# Patient Record
Sex: Female | Born: 1945 | Race: Black or African American | Hispanic: No | Marital: Married | Smoking: Never smoker
Health system: Southern US, Community
[De-identification: ages and names within clinical notes are randomized; demographics above are authoritative.]

## PROBLEM LIST (undated history)

## (undated) DIAGNOSIS — K635 Polyp of colon: Secondary | ICD-10-CM

## (undated) DIAGNOSIS — E119 Type 2 diabetes mellitus without complications: Secondary | ICD-10-CM

## (undated) DIAGNOSIS — E7800 Pure hypercholesterolemia, unspecified: Secondary | ICD-10-CM

## (undated) DIAGNOSIS — Z780 Asymptomatic menopausal state: Secondary | ICD-10-CM

## (undated) DIAGNOSIS — I10 Essential (primary) hypertension: Secondary | ICD-10-CM

## (undated) DIAGNOSIS — E785 Hyperlipidemia, unspecified: Secondary | ICD-10-CM

## (undated) DIAGNOSIS — I25810 Atherosclerosis of coronary artery bypass graft(s) without angina pectoris: Secondary | ICD-10-CM

## (undated) DIAGNOSIS — I2510 Atherosclerotic heart disease of native coronary artery without angina pectoris: Secondary | ICD-10-CM

## (undated) HISTORY — PX: BREAST BIOPSY: SHX20

## (undated) HISTORY — PX: CHOLECYSTECTOMY: SHX55

## (undated) HISTORY — PX: APPENDECTOMY: SHX54

## (undated) HISTORY — PX: BREAST EXCISIONAL BIOPSY: SUR124

---

## 2001-05-16 HISTORY — PX: ABDOMINAL HYSTERECTOMY: SHX81

## 2005-07-07 ENCOUNTER — Ambulatory Visit: Payer: Self-pay | Admitting: Unknown Physician Specialty

## 2006-07-13 ENCOUNTER — Ambulatory Visit: Payer: Self-pay | Admitting: Unknown Physician Specialty

## 2007-01-13 ENCOUNTER — Ambulatory Visit: Payer: Self-pay | Admitting: Gastroenterology

## 2007-09-07 ENCOUNTER — Ambulatory Visit: Payer: Self-pay | Admitting: Unknown Physician Specialty

## 2007-12-15 HISTORY — PX: CORONARY ARTERY BYPASS GRAFT: SHX141

## 2007-12-18 ENCOUNTER — Ambulatory Visit: Payer: Self-pay | Admitting: Cardiology

## 2008-12-19 ENCOUNTER — Ambulatory Visit: Payer: Self-pay | Admitting: Unknown Physician Specialty

## 2010-01-01 ENCOUNTER — Ambulatory Visit: Payer: Self-pay | Admitting: Unknown Physician Specialty

## 2011-05-19 ENCOUNTER — Ambulatory Visit: Payer: Self-pay | Admitting: Unknown Physician Specialty

## 2011-05-28 ENCOUNTER — Ambulatory Visit: Payer: Self-pay | Admitting: Gastroenterology

## 2011-06-01 LAB — PATHOLOGY REPORT

## 2011-08-16 ENCOUNTER — Ambulatory Visit: Payer: Self-pay | Admitting: Unknown Physician Specialty

## 2011-08-17 ENCOUNTER — Ambulatory Visit: Payer: Self-pay | Admitting: Unknown Physician Specialty

## 2011-09-17 ENCOUNTER — Ambulatory Visit: Payer: Self-pay | Admitting: Unknown Physician Specialty

## 2012-05-19 ENCOUNTER — Ambulatory Visit: Payer: Self-pay | Admitting: Physician Assistant

## 2013-07-17 ENCOUNTER — Ambulatory Visit: Payer: Self-pay | Admitting: Physician Assistant

## 2014-01-24 DIAGNOSIS — I2510 Atherosclerotic heart disease of native coronary artery without angina pectoris: Secondary | ICD-10-CM | POA: Insufficient documentation

## 2014-03-05 DIAGNOSIS — E785 Hyperlipidemia, unspecified: Secondary | ICD-10-CM | POA: Insufficient documentation

## 2014-09-18 ENCOUNTER — Ambulatory Visit: Payer: Self-pay | Admitting: Physician Assistant

## 2015-08-12 ENCOUNTER — Other Ambulatory Visit: Payer: Self-pay | Admitting: Physician Assistant

## 2015-08-12 DIAGNOSIS — Z1231 Encounter for screening mammogram for malignant neoplasm of breast: Secondary | ICD-10-CM

## 2015-09-22 ENCOUNTER — Ambulatory Visit
Admission: RE | Admit: 2015-09-22 | Discharge: 2015-09-22 | Disposition: A | Discharge status: Home / Self Care | Payer: BC Managed Care – PPO | Source: Ambulatory Visit | Attending: Physician Assistant | Admitting: Physician Assistant

## 2015-09-22 DIAGNOSIS — Z1231 Encounter for screening mammogram for malignant neoplasm of breast: Secondary | ICD-10-CM

## 2015-11-06 ENCOUNTER — Encounter: Payer: Self-pay | Admitting: *Deleted

## 2015-11-07 ENCOUNTER — Ambulatory Visit: Payer: BC Managed Care – PPO | Admitting: Anesthesiology

## 2015-11-07 ENCOUNTER — Encounter: Payer: Self-pay | Admitting: *Deleted

## 2015-11-07 ENCOUNTER — Encounter
Admission: RE | Disposition: A | Discharge status: Home / Self Care | Payer: Self-pay | Source: Ambulatory Visit | Attending: Gastroenterology

## 2015-11-07 ENCOUNTER — Ambulatory Visit
Admission: RE | Admit: 2015-11-07 | Discharge: 2015-11-07 | Disposition: A | Discharge status: Home / Self Care | Payer: BC Managed Care – PPO | Source: Ambulatory Visit | Attending: Gastroenterology | Admitting: Gastroenterology

## 2015-11-07 DIAGNOSIS — E7800 Pure hypercholesterolemia, unspecified: Secondary | ICD-10-CM | POA: Insufficient documentation

## 2015-11-07 DIAGNOSIS — E119 Type 2 diabetes mellitus without complications: Secondary | ICD-10-CM | POA: Insufficient documentation

## 2015-11-07 DIAGNOSIS — E785 Hyperlipidemia, unspecified: Secondary | ICD-10-CM | POA: Diagnosis not present

## 2015-11-07 DIAGNOSIS — Z1211 Encounter for screening for malignant neoplasm of colon: Secondary | ICD-10-CM | POA: Insufficient documentation

## 2015-11-07 DIAGNOSIS — Z7982 Long term (current) use of aspirin: Secondary | ICD-10-CM | POA: Diagnosis not present

## 2015-11-07 DIAGNOSIS — Z888 Allergy status to other drugs, medicaments and biological substances status: Secondary | ICD-10-CM | POA: Diagnosis not present

## 2015-11-07 DIAGNOSIS — Z86010 Personal history of colonic polyps: Secondary | ICD-10-CM | POA: Insufficient documentation

## 2015-11-07 DIAGNOSIS — I25810 Atherosclerosis of coronary artery bypass graft(s) without angina pectoris: Secondary | ICD-10-CM | POA: Diagnosis not present

## 2015-11-07 DIAGNOSIS — I10 Essential (primary) hypertension: Secondary | ICD-10-CM | POA: Insufficient documentation

## 2015-11-07 DIAGNOSIS — Z880 Allergy status to penicillin: Secondary | ICD-10-CM | POA: Insufficient documentation

## 2015-11-07 DIAGNOSIS — K5730 Diverticulosis of large intestine without perforation or abscess without bleeding: Secondary | ICD-10-CM | POA: Diagnosis not present

## 2015-11-07 HISTORY — DX: Atherosclerosis of coronary artery bypass graft(s) without angina pectoris: I25.810

## 2015-11-07 HISTORY — DX: Essential (primary) hypertension: I10

## 2015-11-07 HISTORY — DX: Atherosclerotic heart disease of native coronary artery without angina pectoris: I25.10

## 2015-11-07 HISTORY — DX: Polyp of colon: K63.5

## 2015-11-07 HISTORY — PX: COLONOSCOPY WITH PROPOFOL: SHX5780

## 2015-11-07 HISTORY — DX: Asymptomatic menopausal state: Z78.0

## 2015-11-07 HISTORY — DX: Type 2 diabetes mellitus without complications: E11.9

## 2015-11-07 HISTORY — DX: Pure hypercholesterolemia, unspecified: E78.00

## 2015-11-07 HISTORY — DX: Hyperlipidemia, unspecified: E78.5

## 2015-11-07 LAB — GLUCOSE, CAPILLARY: Glucose-Capillary: 124 mg/dL — ABNORMAL HIGH (ref 65–99)

## 2015-11-07 SURGERY — COLONOSCOPY WITH PROPOFOL
Anesthesia: General

## 2015-11-07 MED ORDER — PROPOFOL 500 MG/50ML IV EMUL
INTRAVENOUS | Status: DC | PRN
  Administered 2015-11-07: 100 ug/kg/min via INTRAVENOUS

## 2015-11-07 MED ORDER — SODIUM CHLORIDE 0.9 % IV SOLN
INTRAVENOUS | Status: DC
Start: 2015-11-07 — End: 2015-11-07
  Administered 2015-11-07: 12:00:00 via INTRAVENOUS

## 2015-11-07 MED ORDER — PROPOFOL 10 MG/ML IV BOLUS
INTRAVENOUS | Status: DC | PRN
  Administered 2015-11-07: 80 mg via INTRAVENOUS

## 2015-11-07 MED ORDER — SODIUM CHLORIDE 0.9 % IV SOLN: INTRAVENOUS | Status: DC

## 2015-11-07 NOTE — Anesthesia Preprocedure Evaluation (Signed)
Anesthesia Evaluation  Patient identified by MRN, date of birth, ID band Patient awake    Reviewed: Allergy & Precautions, NPO status , Patient's Chart, lab work & pertinent test results  History of Anesthesia Complications Negative for: history of anesthetic complications  Airway Mallampati: II       Dental  (+) Upper Dentures, Partial Lower   Pulmonary neg pulmonary ROS,           Cardiovascular hypertension, Pt. on medications and Pt. on home beta blockers + CAD and + CABG       Neuro/Psych negative neurological ROS     GI/Hepatic negative GI ROS, Neg liver ROS,   Endo/Other  diabetes (pre-diabetes)  Renal/GU negative Renal ROS     Musculoskeletal   Abdominal   Peds  Hematology negative hematology ROS (+)   Anesthesia Other Findings   Reproductive/Obstetrics                             Anesthesia Physical Anesthesia Plan  ASA: II  Anesthesia Plan: General   Post-op Pain Management:    Induction: Intravenous  Airway Management Planned: Nasal Cannula  Additional Equipment:   Intra-op Plan:   Post-operative Plan:   Informed Consent: I have reviewed the patients History and Physical, chart, labs and discussed the procedure including the risks, benefits and alternatives for the proposed anesthesia with the patient or authorized representative who has indicated his/her understanding and acceptance.     Plan Discussed with:   Anesthesia Plan Comments:         Anesthesia Quick Evaluation

## 2015-11-07 NOTE — Op Note (Signed)
[facility] Gastroenterology Patient Name: Pam Baker Procedure Date: 11/07/2015 1:30 PM MRN: [id number] Account #: [id number] Date of Birth: 11-03-1945 Admit Type: Outpatient Age: 70 Room: [facility] ENDO ROOM 3 Gender: Female Note Status: Finalized Procedure:            Colonoscopy Indications:          Personal history of colonic polyps Providers:            Lollie Sails, MD Referring MD:         Precious Bard, MD (Referring MD) Medicines:            Monitored Anesthesia Care Complications:        No immediate complications. Procedure:            Pre-Anesthesia Assessment:                       - ASA Grade Assessment: III - A patient with severe                        systemic disease.                       After obtaining informed consent, the colonoscope was                        passed under direct vision. Throughout the procedure,                        the patient's blood pressure, pulse, and oxygen                        saturations were monitored continuously. The                        Colonoscope was introduced through the anus and                        advanced to the the cecum, identified by appendiceal                        orifice and ileocecal valve. The colonoscopy was                        performed without difficulty. The patient tolerated the                        procedure well. The quality of the bowel preparation                        was good. Findings:      Multiple small and large-mouthed diverticula were found in the sigmoid       colon, descending colon, transverse colon and ascending colon.      Multiple small and large-mouthed diverticula were found in the sigmoid       colon. Petechia were visualized in association with the diverticular       opening.      The digital rectal exam was normal.      The retroflexed view of the distal rectum and anal verge was normal and       showed no anal or rectal  abnormalities. Impression:           -  Diverticulosis in the sigmoid colon, in the                        descending colon, in the transverse colon and in the                        ascending colon.                       - Mild diverticulosis in the sigmoid colon. Petechia                        were visualized in association with the diverticular                        opening.                       - The distal rectum and anal verge are normal on                        retroflexion view.                       - No specimens collected. Recommendation:       - Discharge patient to home.                       - Repeat colonoscopy in 5 years for adenoma                        surveillance. Procedure Code(s):    --- Professional ---                       [phone], Colonoscopy, flexible; diagnostic, including                        collection of specimen(s) by brushing or washing, when                        performed (separate procedure) Diagnosis Code(s):    --- Professional ---                       Z86.010, Personal history of colonic polyps                       K57.30, Diverticulosis of large intestine without                        perforation or abscess without bleeding CPT copyright 2016 American Medical Association. All rights reserved. The codes documented in this report are preliminary and upon coder review may  be revised to meet current compliance requirements. Lollie Sails, MD 11/07/2015 1:56:31 PM This report has been signed electronically. Number of Addenda: 0 Note Initiated On: 11/07/2015 1:30 PM Scope Withdrawal Time: 0 hours 5 minutes 48 seconds  Total Procedure Duration: 0 hours 12 minutes 17 seconds       [facility]

## 2015-11-07 NOTE — Transfer of Care (Signed)
Immediate Anesthesia Transfer of Care Note  Patient: Pam Baker  Procedure(s) Performed: Procedure(s): COLONOSCOPY WITH PROPOFOL (N/A)  Patient Location: PACU and Endoscopy Unit  Anesthesia Type:General  Level of Consciousness: awake, alert  and oriented  Airway & Oxygen Therapy: Patient Spontanous Breathing and Patient connected to nasal cannula oxygen  Post-op Assessment: Report given to RN and Post -op Vital signs reviewed and stable  Post vital signs: Reviewed and stable  Last Vitals:  Filed Vitals:   11/07/15 1151 11/07/15 1400  BP: 156/69 98/53  Pulse: 71 78  Temp: 36.9 C 36.3 C  Resp: 15 18    Complications: No apparent anesthesia complications

## 2015-11-07 NOTE — Anesthesia Postprocedure Evaluation (Signed)
Anesthesia Post Note  Patient: Pam Baker  Procedure(s) Performed: Procedure(s) (LRB): COLONOSCOPY WITH PROPOFOL (N/A)  Patient location during evaluation: Endoscopy Anesthesia Type: General Level of consciousness: awake and alert Pain management: pain level controlled Vital Signs Assessment: post-procedure vital signs reviewed and stable Respiratory status: spontaneous breathing and respiratory function stable Cardiovascular status: stable Anesthetic complications: no    Last Vitals:  Filed Vitals:   11/07/15 1420 11/07/15 1430  BP: 119/56 97/68  Pulse: 54 52  Temp:    Resp: 17 20    Last Pain: There were no vitals filed for this visit.               KEPHART,WILLIAM K

## 2015-11-07 NOTE — H&P (Signed)
Outpatient short stay form Pre-procedure 11/07/2015 1:28 PM Lollie Sails MD  Primary Physician: Jackelyn Poling NP  Reason for visit:  Colonoscopy  History of present illness:  Patient is a 70 year old female presenting today for colonoscopy. His personal history of adenomatous colon polyps with the last colonoscopy being done in October 2012. She does take a 81 mg aspirin daily takes no other blood thinners or aspirin products. She tolerated her prep well.    Current facility-administered medications:  .  0.9 %  sodium chloride infusion, , Intravenous, Continuous, Lollie Sails, MD, Last Rate: 20 mL/hr at 11/07/15 1218 .  0.9 %  sodium chloride infusion, , Intravenous, Continuous, Lollie Sails, MD  Prescriptions prior to admission  Medication Sig Dispense Refill Last Dose  . aspirin 81 MG tablet Take 81 mg by mouth daily.   11/02/2015  . atorvastatin (LIPITOR) 20 MG tablet Take 20 mg by mouth daily.   11/06/2015 at Unknown time  . Calcium Carbonate-Vit D-Min (CALCIUM 600 + MINERALS) 600-200 MG-UNIT TABS Take by mouth.   Past Week at Unknown time  . losartan-hydrochlorothiazide (HYZAAR) 50-12.5 MG tablet Take 1 tablet by mouth daily.   11/07/2015 at 0730  . metoprolol succinate (TOPROL-XL) 100 MG 24 hr tablet Take 100 mg by mouth daily. Take with or immediately following a meal.   11/07/2015 at 0730     Allergies  Allergen Reactions  . Amoxicillin   . Lisinopril      Past Medical History  Diagnosis Date  . Colon polyps     personal history, adenomatous  . Coronary atherosclerosis of autologous vein bypass graft   . Coronary atherosclerosis of native coronary artery   . Diabetes mellitus type 2, uncomplicated (HCC)     diet-controlled  . Hypertension   . Hyperlipidemia   . Postmenopausal   . Pure hypercholesterolemia     Review of systems:      Physical Exam    Heart and lungs: Regular rate and rhythm without rub or gallop, lungs are bilaterally clear.   HEENT: Normocephalic atraumatic eyes are anicteric    Other:     Pertinant exam for procedure: Soft nontender nondistended bowel sounds positive normoactive.    Planned proceedures: Colonoscopy and indicated procedures. I have discussed the risks benefits and complications of procedures to include not limited to bleeding, infection, perforation and the risk of sedation and the patient wishes to proceed.    Lollie Sails, MD Gastroenterology 11/07/2015  1:28 PM

## 2015-11-08 ENCOUNTER — Encounter: Payer: Self-pay | Admitting: Gastroenterology

## 2016-08-12 ENCOUNTER — Other Ambulatory Visit: Payer: Self-pay | Admitting: Physician Assistant

## 2016-08-12 DIAGNOSIS — Z1239 Encounter for other screening for malignant neoplasm of breast: Secondary | ICD-10-CM

## 2016-08-12 DIAGNOSIS — Z0000 Encounter for general adult medical examination without abnormal findings: Secondary | ICD-10-CM

## 2017-09-19 ENCOUNTER — Other Ambulatory Visit: Payer: Self-pay | Admitting: Physician Assistant

## 2017-09-19 DIAGNOSIS — Z1231 Encounter for screening mammogram for malignant neoplasm of breast: Secondary | ICD-10-CM

## 2017-12-06 ENCOUNTER — Ambulatory Visit
Admission: RE | Admit: 2017-12-06 | Discharge: 2017-12-06 | Disposition: A | Discharge status: Home / Self Care | Payer: BC Managed Care – PPO | Source: Ambulatory Visit | Attending: Physician Assistant | Admitting: Physician Assistant

## 2017-12-06 DIAGNOSIS — Z1231 Encounter for screening mammogram for malignant neoplasm of breast: Secondary | ICD-10-CM | POA: Diagnosis present

## 2018-09-07 ENCOUNTER — Other Ambulatory Visit: Payer: Self-pay | Admitting: Physician Assistant

## 2018-09-07 DIAGNOSIS — Z1231 Encounter for screening mammogram for malignant neoplasm of breast: Secondary | ICD-10-CM

## 2019-01-29 ENCOUNTER — Ambulatory Visit
Admission: RE | Admit: 2019-01-29 | Discharge: 2019-01-29 | Disposition: A | Discharge status: Home / Self Care | Payer: BC Managed Care – PPO | Source: Ambulatory Visit | Attending: Physician Assistant | Admitting: Physician Assistant

## 2019-01-29 ENCOUNTER — Other Ambulatory Visit: Payer: Self-pay

## 2019-01-29 DIAGNOSIS — Z1231 Encounter for screening mammogram for malignant neoplasm of breast: Secondary | ICD-10-CM | POA: Diagnosis not present

## 2019-03-08 ENCOUNTER — Other Ambulatory Visit: Payer: Self-pay | Admitting: Physician Assistant

## 2019-03-08 DIAGNOSIS — R3121 Asymptomatic microscopic hematuria: Secondary | ICD-10-CM

## 2019-04-11 ENCOUNTER — Other Ambulatory Visit: Payer: Self-pay

## 2019-04-11 ENCOUNTER — Ambulatory Visit (INDEPENDENT_AMBULATORY_CARE_PROVIDER_SITE_OTHER): Payer: Self-pay | Admitting: Urology

## 2019-04-11 VITALS — BP 145/79 | HR 99 | Ht 61.0 in | Wt 211.0 lb

## 2019-04-11 DIAGNOSIS — K635 Polyp of colon: Secondary | ICD-10-CM | POA: Insufficient documentation

## 2019-04-11 DIAGNOSIS — I25810 Atherosclerosis of coronary artery bypass graft(s) without angina pectoris: Secondary | ICD-10-CM | POA: Insufficient documentation

## 2019-04-11 DIAGNOSIS — Z780 Asymptomatic menopausal state: Secondary | ICD-10-CM | POA: Insufficient documentation

## 2019-04-11 DIAGNOSIS — E119 Type 2 diabetes mellitus without complications: Secondary | ICD-10-CM | POA: Insufficient documentation

## 2019-04-11 DIAGNOSIS — I10 Essential (primary) hypertension: Secondary | ICD-10-CM | POA: Insufficient documentation

## 2019-04-11 DIAGNOSIS — R3129 Other microscopic hematuria: Secondary | ICD-10-CM

## 2019-04-11 LAB — URINALYSIS, COMPLETE
Bilirubin, UA: NEGATIVE
Glucose, UA: NEGATIVE
Ketones, UA: NEGATIVE
Nitrite, UA: NEGATIVE
Protein,UA: NEGATIVE
Specific Gravity, UA: 1.025 (ref 1.005–1.030)
Urobilinogen, Ur: 0.2 mg/dL (ref 0.2–1.0)
pH, UA: 5.5 (ref 5.0–7.5)

## 2019-04-11 LAB — MICROSCOPIC EXAMINATION

## 2019-04-11 NOTE — Progress Notes (Signed)
04/11/19 2:16 PM   Pam Baker 08/13/1946 [id number]  Referring provider: Marinda Elk, MD [place],  [place] [address]  CC: Asymptomatic microscopic hematuria  HPI: I saw Pam Baker in urology clinic today in consultation from Dr. Carrie Mew for asymptomatic microscopic hematuria.  She is a 73 year old female with DM2, hypertension, and CAD status post CABG who is been noted to have microscopic hematuria over the last 5 years on multiple urinalyses.  This is ranged from 4-10 RBCs to 10-50 RBCs per high-power field.  She has never undergone work-up previously.  There is no cross-sectional imaging to review.  She denies any history of gross hematuria or UTIs.  She denies any flank pain or history of nephrolithiasis.  She is a never smoker.  She previously worked in the school system and denies any other carcinogenic exposures.  There is no family history of any urologic malignancies.  There are no aggravating or alleviating factors.  Severity is mild to moderate.   PMH: Past Medical History:  Diagnosis Date  . Colon polyps    personal history, adenomatous  . Coronary atherosclerosis of autologous vein bypass graft   . Coronary atherosclerosis of native coronary artery   . Diabetes mellitus type 2, uncomplicated (HCC)    diet-controlled  . Hyperlipidemia   . Hypertension   . Postmenopausal   . Pure hypercholesterolemia     Surgical History: Past Surgical History:  Procedure Laterality Date  . ABDOMINAL HYSTERECTOMY  10/02   TAH-BSO secondary to postmenopausal bleeding and uterine fibroids  . APPENDECTOMY    . BREAST BIOPSY Bilateral    89 and 80  . CHOLECYSTECTOMY    . COLONOSCOPY WITH PROPOFOL N/A 11/07/2015   Procedure: COLONOSCOPY WITH PROPOFOL;  Surgeon: Lollie Sails, MD;  Location: [facility] ENDOSCOPY;  Service: Endoscopy;  Laterality: N/A;  . CORONARY ARTERY BYPASS GRAFT  05/09   single vessel CABG  surgery    Allergies:  Allergies  Allergen Reactions  . Amoxicillin   . Lisinopril     Family History: Family History  Problem Relation Age of Onset  . Breast cancer Neg Hx     Social History:  reports that she has never smoked. She does not have any smokeless tobacco history on file. She reports that she does not drink alcohol or use drugs.  ROS: Please see flowsheet from today's date for complete review of systems.  Physical Exam: BP (!) 145/79   Pulse 99   Ht 5\' 1"  (1.549 m)   Wt 211 lb (95.7 kg)   BMI 39.87 kg/m    Constitutional:  Alert and oriented, No acute distress. Cardiovascular: No clubbing, cyanosis, or edema. Respiratory: Normal respiratory effort, no increased work of breathing. GI: Abdomen is soft, nontender, nondistended, no abdominal masses GU: No CVA tenderness Lymph: No cervical or inguinal lymphadenopathy. Skin: No rashes, bruises or suspicious lesions. Neurologic: Grossly intact, no focal deficits, moving all 4 extremities. Psychiatric: Normal mood and affect.  Laboratory Data: Multiple prior urinalyses over the last 5 years with 4-10 RBCs, two with 10-50 RBCs  Urinalysis today 0-5 WBCs, 0-2 RBCs, few bacteria, nitrite negative  Pertinent Imaging: None to review  Assessment & Plan:   In summary, the patient is a 73 year old female with long history of microscopic hematuria ranging from 4-10 RBCs to 10-50 RBCs per high-power field.  We discussed common possible etiologies of microscopic hematuria including malignancy, urolithiasis, medical renal disease, and idiopathic. Standard workup recommended by  the AUA includes imaging with CT urogram to assess the upper tracts, and cystoscopy.  We also discussed the different risk categories of microscopic hematuria, and with her age she falls into the high risk category that warrants further work-up with cystoscopy and CT urogram.  RTC for cystoscopy and CT urogram to complete microscopic hematuria  work-up  Billey Co, MD  [facility] [address] [place], [place] [address] [phone]

## 2019-04-11 NOTE — Patient Instructions (Signed)
Cystoscopy Cystoscopy is a procedure that is used to help diagnose and sometimes treat conditions that affect the lower urinary tract. The lower urinary tract includes the bladder and the urethra. The urethra is the tube that drains urine from the bladder. Cystoscopy is done using a thin, tube-shaped instrument with a light and camera at the end (cystoscope). The cystoscope may be hard or flexible, depending on the goal of the procedure. The cystoscope is inserted through the urethra, into the bladder. Cystoscopy may be recommended if you have:  Urinary tract infections that keep coming back.  Blood in the urine (hematuria).  An inability to control when you urinate (urinary incontinence) or an overactive bladder.  Unusual cells found in a urine sample.  A blockage in the urethra, such as a urinary stone.  Painful urination.  An abnormality in the bladder found during an intravenous pyelogram (IVP) or CT scan. Cystoscopy may also be done to remove a sample of tissue to be examined under a microscope (biopsy). Tell a health care provider about:  Any allergies you have.  All medicines you are taking, including vitamins, herbs, eye drops, creams, and over-the-counter medicines.  Any problems you or family members have had with anesthetic medicines.  Any blood disorders you have.  Any surgeries you have had.  Any medical conditions you have.  Whether you are pregnant or may be pregnant. What are the risks? Generally, this is a safe procedure. However, problems may occur, including:  Infection.  Bleeding.  Allergic reactions to medicines.  Damage to other structures or organs. What happens before the procedure?  Ask your health care provider about: ? Changing or stopping your regular medicines. This is especially important if you are taking diabetes medicines or blood thinners. ? Taking medicines such as aspirin and ibuprofen. These medicines can thin your blood. Do not take  these medicines unless your health care provider tells you to take them. ? Taking over-the-counter medicines, vitamins, herbs, and supplements.  Follow instructions from your health care provider about eating or drinking restrictions.  Ask your health care provider what steps will be taken to help prevent infection. These may include: ? Washing skin with a germ-killing soap. ? Taking antibiotic medicine.  You may have an exam or testing, such as: ? X-rays of the bladder, urethra, or kidneys. ? Urine tests to check for signs of infection.  Plan to have someone take you home from the hospital or clinic. What happens during the procedure?   You will be given one or more of the following: ? A medicine to help you relax (sedative). ? A medicine to numb the area (local anesthetic).  The area around the opening of your urethra will be cleaned.  The cystoscope will be passed through your urethra into your bladder.  Germ-free (sterile) fluid will flow through the cystoscope to fill your bladder. The fluid will stretch your bladder so that your health care provider can clearly examine your bladder walls.  Your doctor will look at the urethra and bladder. Your doctor may take a biopsy or remove stones.  The cystoscope will be removed, and your bladder will be emptied. The procedure may vary among health care providers and hospitals. What can I expect after the procedure? After the procedure, it is common to have:  Some soreness or pain in your abdomen and urethra.  Urinary symptoms. These include: ? Mild pain or burning when you urinate. Pain should stop within a few minutes after you urinate. This   may last for up to 1 week. ? A small amount of blood in your urine for several days. ? Feeling like you need to urinate but producing only a small amount of urine. Follow these instructions at home: Medicines  Take over-the-counter and prescription medicines only as told by your health care  provider.  If you were prescribed an antibiotic medicine, take it as told by your health care provider. Do not stop taking the antibiotic even if you start to feel better. General instructions  Return to your normal activities as told by your health care provider. Ask your health care provider what activities are safe for you.  Do not drive for 24 hours if you were given a sedative during your procedure.  Watch for any blood in your urine. If the amount of blood in your urine increases, call your health care provider.  Follow instructions from your health care provider about eating or drinking restrictions.  If a tissue sample was removed for testing (biopsy) during your procedure, it is up to you to get your test results. Ask your health care provider, or the department that is doing the test, when your results will be ready.  Drink enough fluid to keep your urine pale yellow.  Keep all follow-up visits as told by your health care provider. This is important. Contact a health care provider if you:  Have pain that gets worse or does not get better with medicine, especially pain when you urinate.  Have trouble urinating.  Have more blood in your urine. Get help right away if you:  Have blood clots in your urine.  Have abdominal pain.  Have a fever or chills.  Are unable to urinate. Summary  Cystoscopy is a procedure that is used to help diagnose and sometimes treat conditions that affect the lower urinary tract.  Cystoscopy is done using a thin, tube-shaped instrument with a light and camera at the end.  After the procedure, it is common to have some soreness or pain in your abdomen and urethra.  Watch for any blood in your urine. If the amount of blood in your urine increases, call your health care provider.  If you were prescribed an antibiotic medicine, take it as told by your health care provider. Do not stop taking the antibiotic even if you start to feel better. This  information is not intended to replace advice given to you by your health care provider. Make sure you discuss any questions you have with your health care provider. Document Released: 07/30/2000 Document Revised: 07/25/2018 Document Reviewed: 07/25/2018 Elsevier Patient Education  2020 Elsevier Inc.  

## 2019-05-10 ENCOUNTER — Other Ambulatory Visit: Payer: Self-pay

## 2019-05-10 ENCOUNTER — Encounter: Payer: Self-pay | Admitting: Urology

## 2019-05-10 ENCOUNTER — Ambulatory Visit (INDEPENDENT_AMBULATORY_CARE_PROVIDER_SITE_OTHER): Payer: BC Managed Care – PPO | Admitting: Urology

## 2019-05-10 VITALS — BP 144/73 | HR 105 | Ht 61.0 in | Wt 209.0 lb

## 2019-05-10 DIAGNOSIS — R3129 Other microscopic hematuria: Secondary | ICD-10-CM | POA: Diagnosis not present

## 2019-05-10 LAB — URINALYSIS, COMPLETE
Bilirubin, UA: NEGATIVE
Glucose, UA: NEGATIVE
Ketones, UA: NEGATIVE
Nitrite, UA: NEGATIVE
Protein,UA: NEGATIVE
Specific Gravity, UA: 1.02 (ref 1.005–1.030)
Urobilinogen, Ur: 0.2 mg/dL (ref 0.2–1.0)
pH, UA: 5.5 (ref 5.0–7.5)

## 2019-05-10 LAB — MICROSCOPIC EXAMINATION: Bacteria, UA: NONE SEEN

## 2019-05-10 NOTE — Progress Notes (Signed)
Cystoscopy Procedure Note:  Indication: Microscopic hematuria  After informed consent and discussion of the procedure and its risks, Pam Baker was positioned and prepped in the standard fashion. Cystoscopy was performed with a flexible cystoscope. The urethra, bladder neck and entire bladder was visualized in a standard fashion. The ureteral orifices were visualized in their normal location and orientation.  No abnormalities on retroflexion.  The bladder mucosa was grossly normal throughout.  Imaging: CTU not performed yet, will call with results  Findings: Normal cystoscopy  Assessment and Plan: Obtain CTU, will call with results  Nickolas Madrid, MD 05/10/2019

## 2020-01-25 ENCOUNTER — Other Ambulatory Visit: Payer: Self-pay | Admitting: Physician Assistant

## 2020-01-25 DIAGNOSIS — Z1231 Encounter for screening mammogram for malignant neoplasm of breast: Secondary | ICD-10-CM

## 2020-02-06 ENCOUNTER — Ambulatory Visit
Admission: RE | Admit: 2020-02-06 | Discharge: 2020-02-06 | Disposition: A | Discharge status: Home / Self Care | Payer: BC Managed Care – PPO | Source: Ambulatory Visit | Attending: Physician Assistant | Admitting: Physician Assistant

## 2020-02-06 DIAGNOSIS — Z1231 Encounter for screening mammogram for malignant neoplasm of breast: Secondary | ICD-10-CM | POA: Diagnosis present

## 2021-01-08 ENCOUNTER — Other Ambulatory Visit: Payer: Self-pay | Admitting: Physician Assistant

## 2021-01-08 DIAGNOSIS — Z1231 Encounter for screening mammogram for malignant neoplasm of breast: Secondary | ICD-10-CM

## 2021-02-06 ENCOUNTER — Other Ambulatory Visit: Payer: Self-pay

## 2021-02-06 ENCOUNTER — Ambulatory Visit
Admission: RE | Admit: 2021-02-06 | Discharge: 2021-02-06 | Disposition: A | Discharge status: Home / Self Care | Payer: BC Managed Care – PPO | Source: Ambulatory Visit | Attending: Physician Assistant | Admitting: Physician Assistant

## 2021-02-06 DIAGNOSIS — Z1231 Encounter for screening mammogram for malignant neoplasm of breast: Secondary | ICD-10-CM | POA: Diagnosis not present

## 2021-03-16 ENCOUNTER — Encounter
Admission: RE | Disposition: A | Discharge status: Home / Self Care | Payer: Self-pay | Source: Home / Self Care | Attending: Gastroenterology

## 2021-03-16 ENCOUNTER — Ambulatory Visit
Admission: RE | Admit: 2021-03-16 | Discharge: 2021-03-16 | Disposition: A | Discharge status: Home / Self Care | Payer: BC Managed Care – PPO | Attending: Gastroenterology | Admitting: Gastroenterology

## 2021-03-16 ENCOUNTER — Ambulatory Visit: Payer: BC Managed Care – PPO | Admitting: Certified Registered Nurse Anesthetist

## 2021-03-16 DIAGNOSIS — I10 Essential (primary) hypertension: Secondary | ICD-10-CM | POA: Diagnosis not present

## 2021-03-16 DIAGNOSIS — E785 Hyperlipidemia, unspecified: Secondary | ICD-10-CM | POA: Insufficient documentation

## 2021-03-16 DIAGNOSIS — Z6837 Body mass index (BMI) 37.0-37.9, adult: Secondary | ICD-10-CM | POA: Insufficient documentation

## 2021-03-16 DIAGNOSIS — Z1211 Encounter for screening for malignant neoplasm of colon: Secondary | ICD-10-CM | POA: Diagnosis present

## 2021-03-16 DIAGNOSIS — Z951 Presence of aortocoronary bypass graft: Secondary | ICD-10-CM | POA: Insufficient documentation

## 2021-03-16 DIAGNOSIS — D120 Benign neoplasm of cecum: Secondary | ICD-10-CM | POA: Insufficient documentation

## 2021-03-16 DIAGNOSIS — Z880 Allergy status to penicillin: Secondary | ICD-10-CM | POA: Insufficient documentation

## 2021-03-16 DIAGNOSIS — Z888 Allergy status to other drugs, medicaments and biological substances status: Secondary | ICD-10-CM | POA: Insufficient documentation

## 2021-03-16 DIAGNOSIS — Z79899 Other long term (current) drug therapy: Secondary | ICD-10-CM | POA: Diagnosis not present

## 2021-03-16 DIAGNOSIS — D122 Benign neoplasm of ascending colon: Secondary | ICD-10-CM | POA: Insufficient documentation

## 2021-03-16 DIAGNOSIS — Z7982 Long term (current) use of aspirin: Secondary | ICD-10-CM | POA: Diagnosis not present

## 2021-03-16 DIAGNOSIS — E669 Obesity, unspecified: Secondary | ICD-10-CM | POA: Insufficient documentation

## 2021-03-16 DIAGNOSIS — K5730 Diverticulosis of large intestine without perforation or abscess without bleeding: Secondary | ICD-10-CM | POA: Diagnosis not present

## 2021-03-16 HISTORY — PX: COLONOSCOPY WITH PROPOFOL: SHX5780

## 2021-03-16 LAB — GLUCOSE, CAPILLARY: Glucose-Capillary: 99 mg/dL (ref 70–99)

## 2021-03-16 SURGERY — COLONOSCOPY WITH PROPOFOL
Anesthesia: General

## 2021-03-16 MED ORDER — PROPOFOL 500 MG/50ML IV EMUL
INTRAVENOUS | Status: AC
  Filled 2021-03-16: qty 50

## 2021-03-16 MED ORDER — PROPOFOL 10 MG/ML IV BOLUS
INTRAVENOUS | Status: AC
  Filled 2021-03-16: qty 20

## 2021-03-16 MED ORDER — SODIUM CHLORIDE 0.9 % IV SOLN
INTRAVENOUS | Status: DC
  Administered 2021-03-16: 20 mL/h via INTRAVENOUS

## 2021-03-16 MED ORDER — LIDOCAINE HCL (CARDIAC) PF 100 MG/5ML IV SOSY
PREFILLED_SYRINGE | INTRAVENOUS | Status: DC | PRN
  Administered 2021-03-16: 50 mg via INTRAVENOUS

## 2021-03-16 MED ORDER — PROPOFOL 10 MG/ML IV BOLUS
INTRAVENOUS | Status: DC | PRN
Start: 2021-03-16 — End: 2021-03-16
  Administered 2021-03-16: 50 mg via INTRAVENOUS

## 2021-03-16 MED ORDER — PROPOFOL 500 MG/50ML IV EMUL
INTRAVENOUS | Status: DC | PRN
  Administered 2021-03-16: 125 ug/kg/min via INTRAVENOUS

## 2021-03-16 NOTE — H&P (Signed)
Outpatient short stay form Pre-procedure 03/16/2021 10:17 AM Pam Miyamoto MD, MPH  Primary Physician: Dr. Carrie Mew  Reason for visit:  Surveillance colonoscopy  History of present illness:   75 y/o lady with history of obesity, hypertension, and HLD here for surveillance colonoscopy for history of polyps. Last colonoscopy was 5 years ago and only showed diverticulosis. No family history of GI malignancies. No blood thinners. No significant abdominal surgeries.    Current Facility-Administered Medications:    0.9 %  sodium chloride infusion, , Intravenous, Continuous, Tammala Weider, Hilton Cork, MD, Last Rate: 20 mL/hr at 03/16/21 0853, Restarted at 03/16/21 1009  Facility-Administered Medications Ordered in Other Encounters:    lidocaine (cardiac) 100 mg/66m (XYLOCAINE) injection 2%, , Intravenous, Anesthesia Intra-op, NWillette Alma CRNA, 50 mg at 03/16/21 1011  Medications Prior to Admission  Medication Sig Dispense Refill Last Dose   aspirin 81 MG tablet Take 81 mg by mouth daily.   03/15/2021   atorvastatin (LIPITOR) 20 MG tablet Take 20 mg by mouth daily.   03/15/2021   Calcium Carbonate-Vit D-Min (CALCIUM 600 + MINERALS) 600-200 MG-UNIT TABS Take by mouth.   03/15/2021   losartan-hydrochlorothiazide (HYZAAR) 50-12.5 MG tablet Take 1 tablet by mouth daily.   03/15/2021   metoprolol succinate (TOPROL-XL) 100 MG 24 hr tablet Take 100 mg by mouth daily. Take with or immediately following a meal.   03/16/2021 at 0500   valsartan-hydrochlorothiazide (DIOVAN-HCT) 80-12.5 MG tablet Take 1 tablet by mouth daily.   03/16/2021 at 0500     Allergies  Allergen Reactions   Amoxicillin    Lisinopril      Past Medical History:  Diagnosis Date   Colon polyps    personal history, adenomatous   Coronary atherosclerosis of autologous vein bypass graft    Coronary atherosclerosis of native coronary artery    Diabetes mellitus type 2, uncomplicated (HCC)    diet-controlled   Hyperlipidemia     Hypertension    Postmenopausal    Pure hypercholesterolemia     Review of systems:  Otherwise negative.    Physical Exam  Gen: Alert, oriented. Appears stated age.  HEENT: PERRLA. Lungs: No respiratory distress CV: RRR Abd: soft, benign, no masses Ext: No edema    Planned procedures: Proceed with colonoscopy. The patient understands the nature of the planned procedure, indications, risks, alternatives and potential complications including but not limited to bleeding, infection, perforation, damage to internal organs and possible oversedation/side effects from anesthesia. The patient agrees and gives consent to proceed.  Please refer to procedure notes for findings, recommendations and patient disposition/instructions.     TRaylene MiyamotoMD, MPH Gastroenterology 03/16/2021  10:17 AM

## 2021-03-16 NOTE — Anesthesia Preprocedure Evaluation (Signed)
Anesthesia Evaluation  Patient identified by MRN, date of birth, ID band Patient awake    Reviewed: Allergy & Precautions, NPO status , Patient's Chart, lab work & pertinent test results  History of Anesthesia Complications Negative for: history of anesthetic complications  Airway Mallampati: II       Dental  (+) Upper Dentures, Partial Lower, Missing   Pulmonary neg pulmonary ROS,    Pulmonary exam normal        Cardiovascular hypertension, Pt. on medications and Pt. on home beta blockers + CAD and + CABG   Rhythm:Regular Rate:Normal     Neuro/Psych negative neurological ROS  negative psych ROS   GI/Hepatic negative GI ROS, Neg liver ROS,   Endo/Other  diabetes (borderline)  Renal/GU negative Renal ROS     Musculoskeletal   Abdominal (+) + obese,   Peds  Hematology negative hematology ROS (+)   Anesthesia Other Findings   Reproductive/Obstetrics                             Anesthesia Physical  Anesthesia Plan  ASA: 3  Anesthesia Plan: General   Post-op Pain Management:    Induction: Intravenous  PONV Risk Score and Plan: Treatment may vary due to age or medical condition  Airway Management Planned: Nasal Cannula and Natural Airway  Additional Equipment:   Intra-op Plan:   Post-operative Plan:   Informed Consent: I have reviewed the patients History and Physical, chart, labs and discussed the procedure including the risks, benefits and alternatives for the proposed anesthesia with the patient or authorized representative who has indicated his/her understanding and acceptance.     Dental advisory given  Plan Discussed with: Anesthesiologist and CRNA  Anesthesia Plan Comments:         Anesthesia Quick Evaluation

## 2021-03-16 NOTE — Interval H&P Note (Signed)
History and Physical Interval Note:  03/16/2021 10:20 AM  Pam Baker  has presented today for surgery, with the diagnosis of HX.OF COLON POLYPS.  The various methods of treatment have been discussed with the patient and family. After consideration of risks, benefits and other options for treatment, the patient has consented to  Procedure(s): COLONOSCOPY WITH PROPOFOL (N/A) as a surgical intervention.  The patient's history has been reviewed, patient examined, no change in status, stable for surgery.  I have reviewed the patient's chart and labs.  Questions were answered to the patient's satisfaction.     Lesly Rubenstein  Ok to proceed with colonoscopy

## 2021-03-16 NOTE — Transfer of Care (Signed)
Immediate Anesthesia Transfer of Care Note  Patient: Pam Baker  Procedure(s) Performed: COLONOSCOPY WITH PROPOFOL  Patient Location: PACU  Anesthesia Type:General  Level of Consciousness: awake, alert  and oriented  Airway & Oxygen Therapy: Patient Spontanous Breathing and Patient connected to nasal cannula oxygen  Post-op Assessment: Report given to RN and Post -op Vital signs reviewed and stable  Post vital signs: Reviewed and stable  Last Vitals:  Vitals Value Taken Time  BP 94/58 03/16/21 1044  Temp 35.7 C 03/16/21 1044  Pulse 66 03/16/21 1044  Resp 16 03/16/21 1044  SpO2 99 % 03/16/21 1044  Vitals shown include unvalidated device data.  Last Pain:  Vitals:   03/16/21 1044  TempSrc: Temporal  PainSc: Asleep         Complications: No notable events documented.

## 2021-03-16 NOTE — Op Note (Signed)
[facility] Patient Name: Pam Baker Procedure Date: 03/16/2021 10:04 AM MRN: [id number] Account #: [id number] Date of Birth: [birth year] Admit Type: Outpatient Age: 75 Room: [facility] ENDO ROOM 3 Gender: Female Note Status: Finalized Procedure:             Colonoscopy Indications:           Surveillance: Personal history of adenomatous polyps                         on last colonoscopy > 5 years ago Providers:             Andrey Farmer MD, MD Medicines:             Monitored Anesthesia Care Complications:         No immediate complications. Estimated blood loss:                         Minimal. Procedure:             Pre-Anesthesia Assessment:                        - Prior to the procedure, a History and Physical was                         performed, and patient medications and allergies were                         reviewed. The patient is competent. The risks and                         benefits of the procedure and the sedation options and                         risks were discussed with the patient. All questions                         were answered and informed consent was obtained.                         Patient identification and proposed procedure were                         verified by the physician, the nurse, the anesthetist                         and the technician in the endoscopy suite. Mental                         Status Examination: alert and oriented. Airway                         Examination: normal oropharyngeal airway and neck                         mobility. Respiratory Examination: clear to                         auscultation. CV Examination: normal. Prophylactic  Antibiotics: The patient does not require prophylactic                         antibiotics. Prior Anticoagulants: The patient has                         taken no previous anticoagulant or antiplatelet                          agents. ASA Grade Assessment: II - A patient with mild                         systemic disease. After reviewing the risks and                         benefits, the patient was deemed in satisfactory                         condition to undergo the procedure. The anesthesia                         plan was to use monitored anesthesia care (MAC).                         Immediately prior to administration of medications,                         the patient was re-assessed for adequacy to receive                         sedatives. The heart rate, respiratory rate, oxygen                         saturations, blood pressure, adequacy of pulmonary                         ventilation, and response to care were monitored                         throughout the procedure. The physical status of the                         patient was re-assessed after the procedure.                        After obtaining informed consent, the colonoscope was                         passed under direct vision. Throughout the procedure,                         the patient's blood pressure, pulse, and oxygen                         saturations were monitored continuously. The                         Colonoscope was introduced through the anus and  advanced to the the cecum, identified by appendiceal                         orifice and ileocecal valve. The colonoscopy was                         performed without difficulty. The patient tolerated                         the procedure well. The quality of the bowel                         preparation was good. Findings:      The perianal and digital rectal examinations were normal.      A 1 mm polyp was found in the cecum. The polyp was sessile. The polyp       was removed with a jumbo cold forceps. Resection and retrieval were       complete. Estimated blood loss was minimal.      A 2 mm polyp was found in the ascending colon. The polyp was  sessile.       The polyp was removed with a jumbo cold forceps. Resection and retrieval       were complete. Estimated blood loss was minimal.      A 3 mm polyp was found in the proximal transverse colon. The polyp was       sessile. The polyp was removed with a cold snare. Resection was       complete, but the polyp tissue was not retrieved. Estimated blood loss       was minimal.      Multiple small-mouthed diverticula were found in the sigmoid colon,       descending colon, transverse colon, hepatic flexure, ascending colon and       cecum.      The exam was otherwise without abnormality on direct and retroflexion       views. Impression:            - One 1 mm polyp in the cecum, removed with a jumbo                         cold forceps. Resected and retrieved.                        - One 2 mm polyp in the ascending colon, removed with                         a jumbo cold forceps. Resected and retrieved.                        - One 3 mm polyp in the proximal transverse colon,                         removed with a cold snare. Complete resection. Polyp                         tissue not retrieved.                        - Diverticulosis in the sigmoid colon,  in the                         descending colon, in the transverse colon, at the                         hepatic flexure, in the ascending colon and in the                         cecum.                        - The examination was otherwise normal on direct and                         retroflexion views. Recommendation:        - Discharge patient to home.                        - Resume previous diet.                        - Continue present medications.                        - Await pathology results.                        - Repeat colonoscopy is not recommended due to current                         age (58 years or older) for surveillance.                        - Return to referring physician as previously                          scheduled. Procedure Code(s):     --- Professional ---                        [phone], Colonoscopy, flexible; with removal of                         tumor(s), polyp(s), or other lesion(s) by snare                         technique                        45380, 50, Colonoscopy, flexible; with biopsy, single                         or multiple Diagnosis Code(s):     --- Professional ---                        Z86.010, Personal history of colonic polyps                        K63.5, Polyp of colon  K57.30, Diverticulosis of large intestine without                         perforation or abscess without bleeding CPT copyright 2019 [employer]. All rights reserved. The codes documented in this report are preliminary and upon coder review may  be revised to meet current compliance requirements. Andrey Farmer MD, MD 03/16/2021 10:51:56 AM Number of Addenda: 0 Note Initiated On: 03/16/2021 10:04 AM Scope Withdrawal Time: 0 hours 11 minutes 20 seconds  Total Procedure Duration: 0 hours 16 minutes 17 seconds  Estimated Blood Loss:  Estimated blood loss was minimal.      [facility]

## 2021-03-17 ENCOUNTER — Encounter: Payer: Self-pay | Admitting: Gastroenterology

## 2021-03-17 LAB — SURGICAL PATHOLOGY

## 2021-03-17 NOTE — Anesthesia Postprocedure Evaluation (Signed)
Anesthesia Post Note  Patient: Pam Baker  Procedure(s) Performed: COLONOSCOPY WITH PROPOFOL  Patient location during evaluation: Endoscopy Anesthesia Type: General Level of consciousness: awake and alert Pain management: pain level controlled Vital Signs Assessment: post-procedure vital signs reviewed and stable Respiratory status: spontaneous breathing, nonlabored ventilation and respiratory function stable Cardiovascular status: blood pressure returned to baseline and stable Postop Assessment: no apparent nausea or vomiting Anesthetic complications: no   No notable events documented.   Last Vitals:  Vitals:   03/16/21 0841 03/16/21 1044  BP: (!) 153/69 (!) 94/58  Pulse: 80   Resp: 20   Temp: (!) 35.8 C (!) 35.7 C  SpO2: 100%     Last Pain:  Vitals:   03/17/21 0749  TempSrc:   PainSc: 0-No pain                 [place] Ouch

## 2021-09-28 ENCOUNTER — Other Ambulatory Visit: Payer: Self-pay | Admitting: Physician Assistant

## 2021-09-28 DIAGNOSIS — Z1231 Encounter for screening mammogram for malignant neoplasm of breast: Secondary | ICD-10-CM

## 2022-02-09 ENCOUNTER — Ambulatory Visit
Admission: RE | Admit: 2022-02-09 | Discharge: 2022-02-09 | Disposition: A | Discharge status: Home / Self Care | Payer: BC Managed Care – PPO | Source: Ambulatory Visit | Attending: Physician Assistant | Admitting: Physician Assistant

## 2022-02-09 DIAGNOSIS — Z1231 Encounter for screening mammogram for malignant neoplasm of breast: Secondary | ICD-10-CM | POA: Diagnosis present

## 2023-01-25 ENCOUNTER — Other Ambulatory Visit: Payer: Self-pay | Admitting: Physician Assistant

## 2023-01-25 DIAGNOSIS — Z1231 Encounter for screening mammogram for malignant neoplasm of breast: Secondary | ICD-10-CM

## 2023-02-11 ENCOUNTER — Ambulatory Visit
Admission: RE | Admit: 2023-02-11 | Discharge: 2023-02-11 | Disposition: A | Discharge status: Home / Self Care | Payer: BC Managed Care – PPO | Source: Ambulatory Visit | Attending: Physician Assistant | Admitting: Physician Assistant

## 2023-02-11 DIAGNOSIS — Z1231 Encounter for screening mammogram for malignant neoplasm of breast: Secondary | ICD-10-CM | POA: Diagnosis present

## 2023-02-28 IMAGING — MG MM DIGITAL SCREENING BILAT W/ TOMO AND CAD
8 of 15 series · 8 of 40 positions shown · non-contrast
Comparison: Previous exam(s).

CLINICAL DATA: Screening.

EXAM:
DIGITAL SCREENING BILATERAL MAMMOGRAM WITH TOMOSYNTHESIS AND CAD
TECHNIQUE: Bilateral screening digital craniocaudal and mediolateral oblique
mammograms were obtained. Bilateral screening digital breast
tomosynthesis was performed. The images were evaluated with
computer-aided detection.

[R MLO synth-2D (1 of 2)]
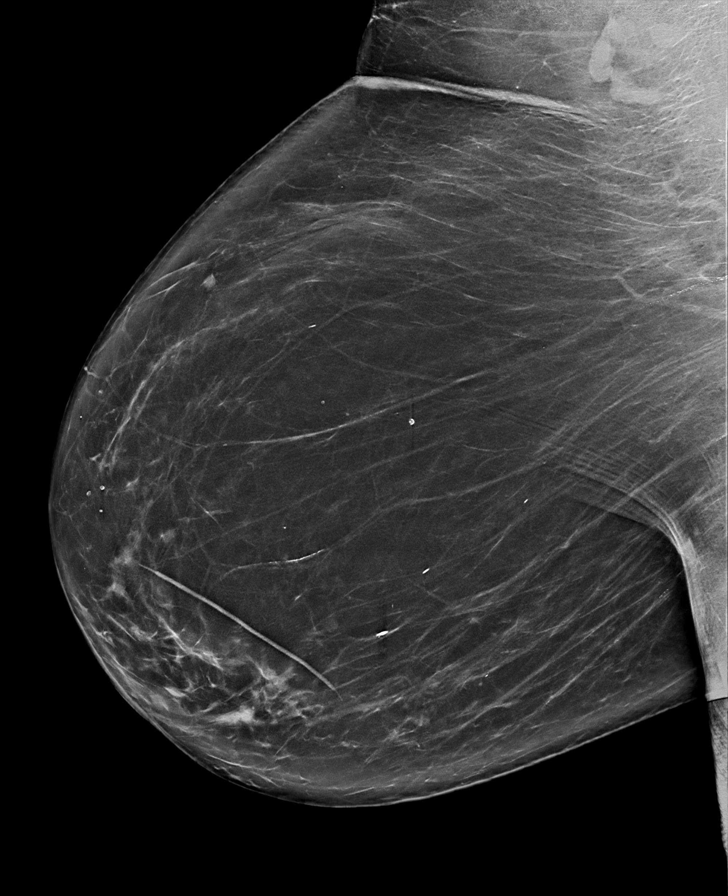

[R CC synth-2D]
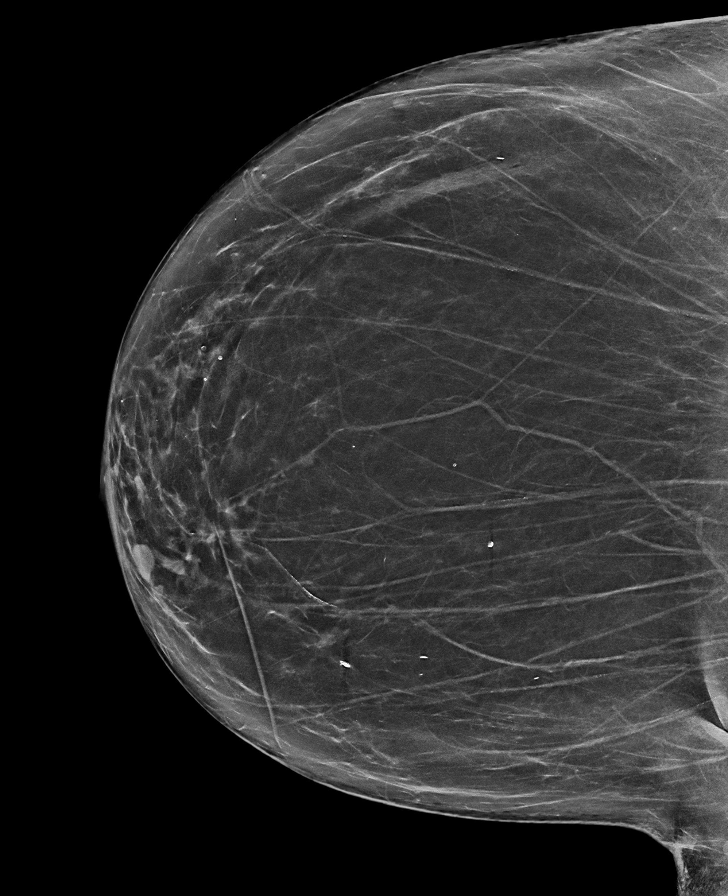

[L CV synth-2D]
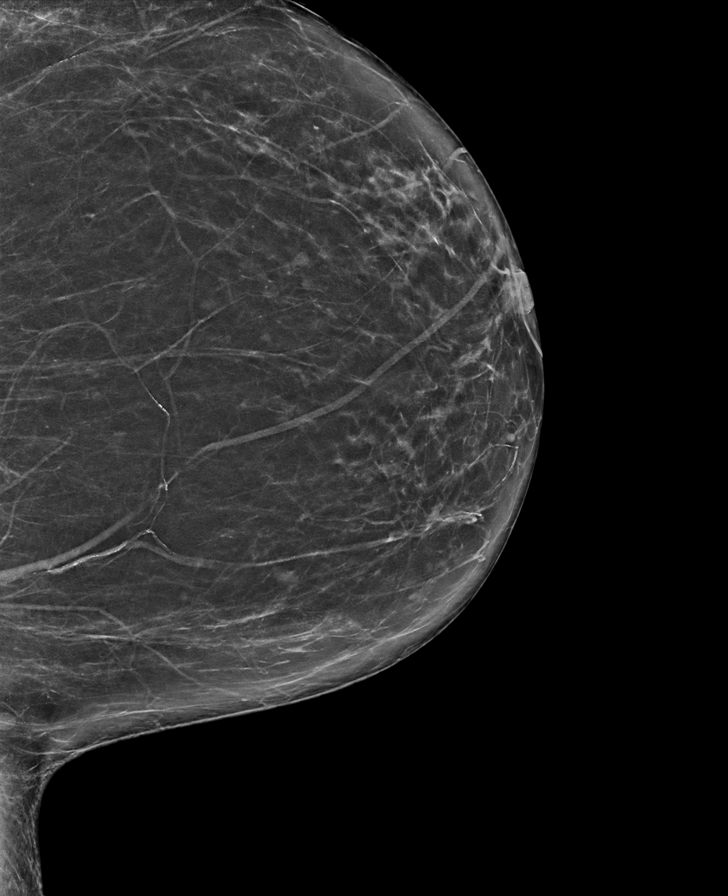

[L CC synth-2D]
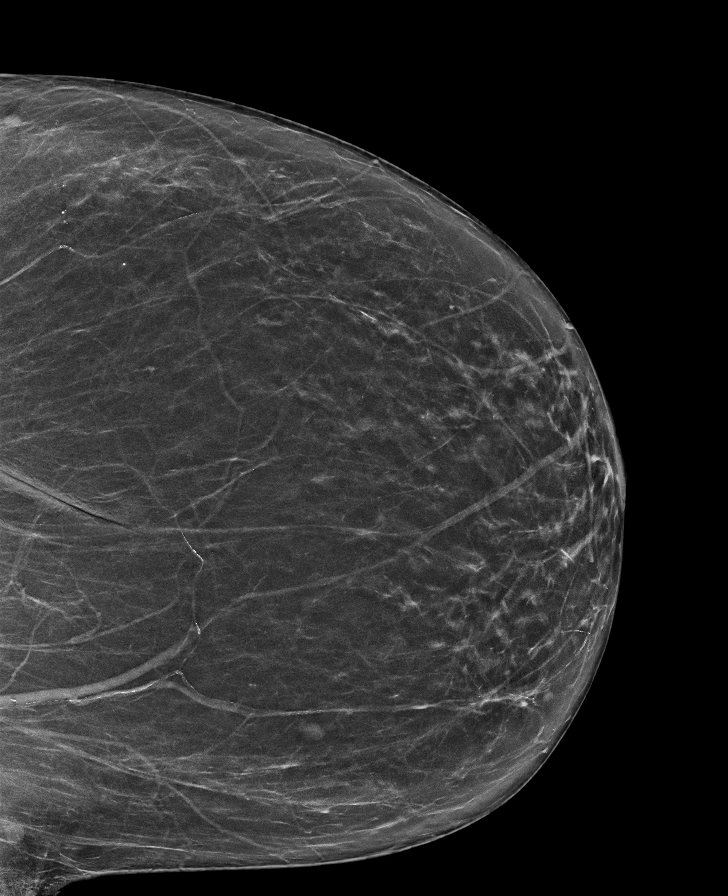

[L MLO synth-2D]
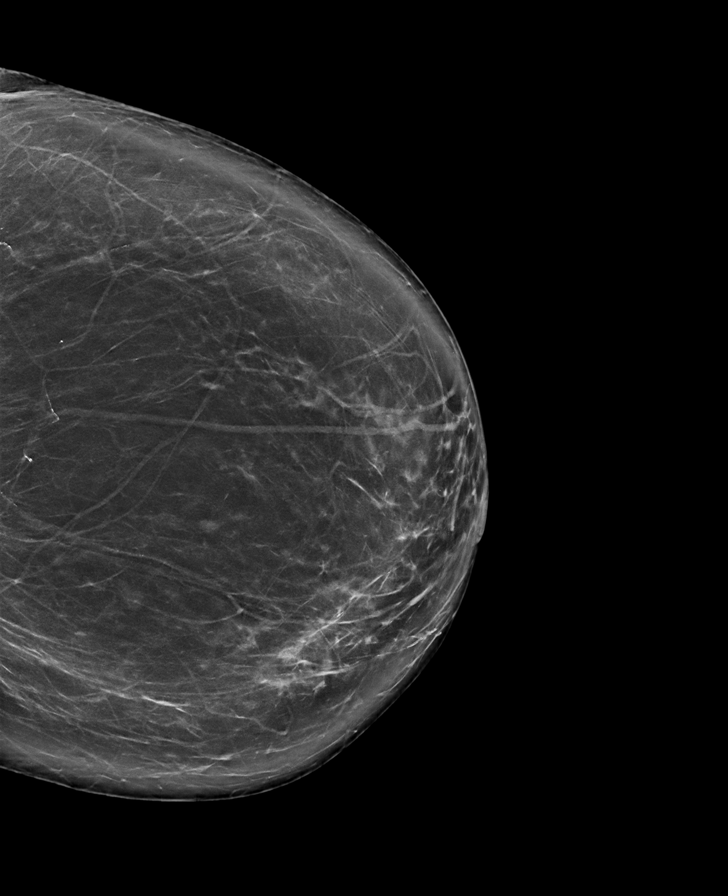

[R CV synth-2D]
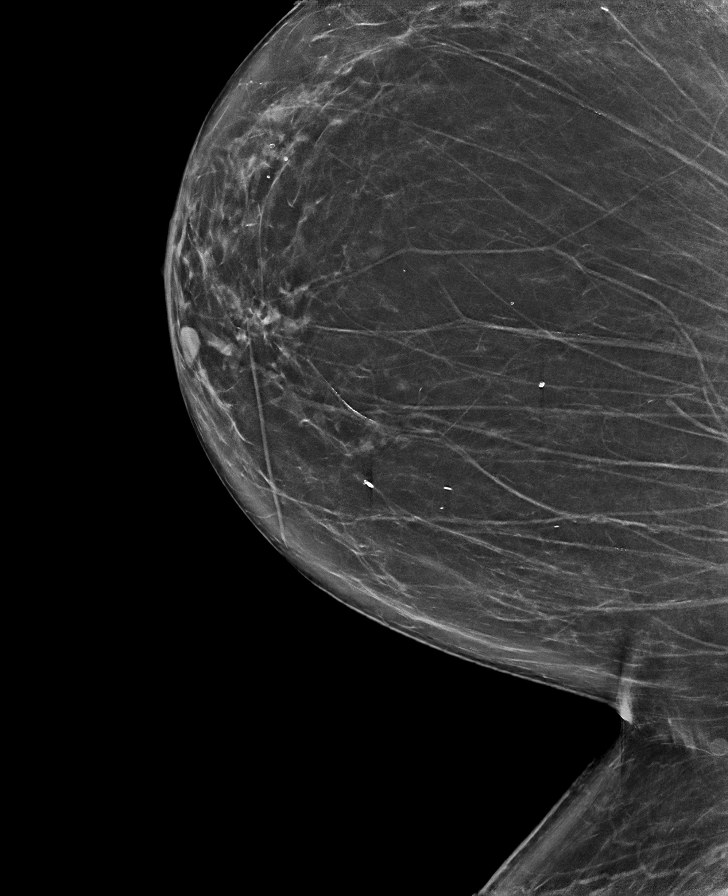

[R MLO synth-2D (2 of 2)]
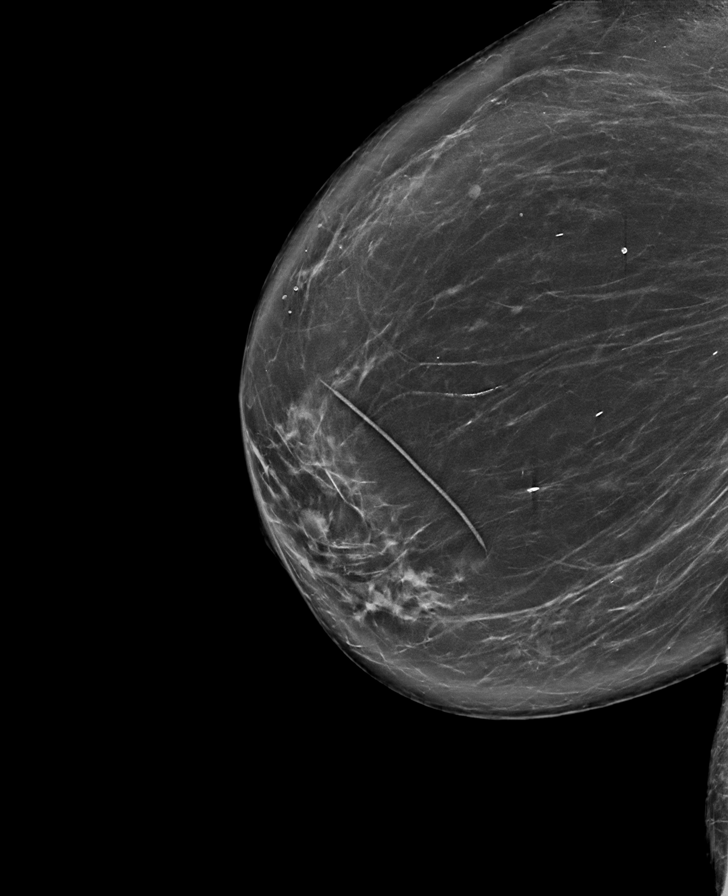

[R CC tomo · tomo slice 53/78.0]
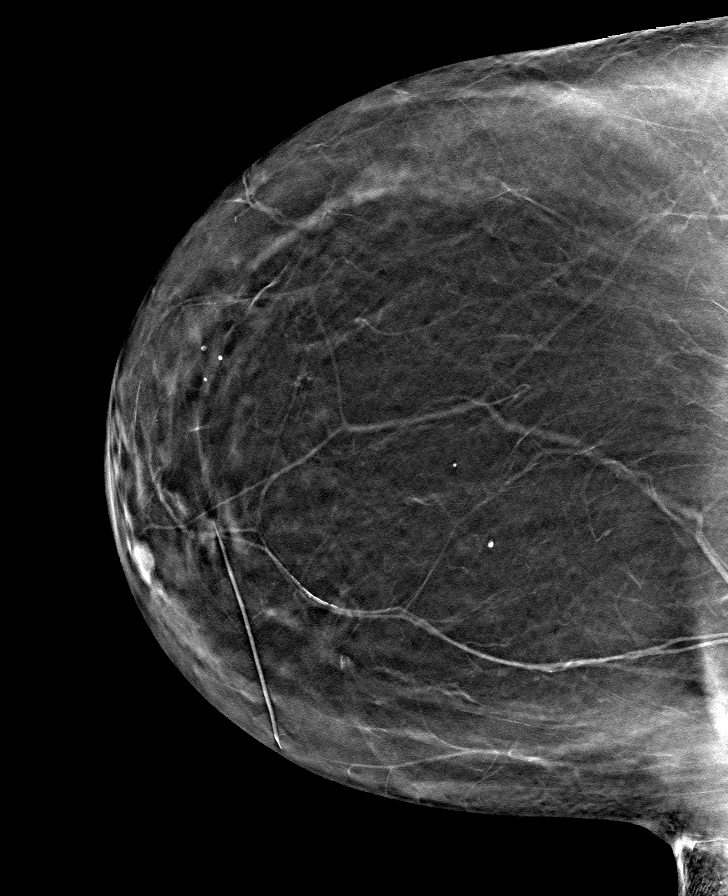

[8 of 40 positions shown; findings below may reference images not displayed]

ACR Breast Density Category b: There are scattered areas of
fibroglandular density.
FINDINGS: There are no findings suspicious for malignancy.
IMPRESSION: No mammographic evidence of malignancy. A result letter of this
screening mammogram will be mailed directly to the patient.

RECOMMENDATION:
Screening mammogram in one year. (Code:51-O-LD2)

BI-RADS CATEGORY  1: Negative.

## 2023-04-05 ENCOUNTER — Other Ambulatory Visit: Payer: Self-pay | Admitting: Physician Assistant

## 2023-04-05 DIAGNOSIS — R634 Abnormal weight loss: Secondary | ICD-10-CM

## 2023-04-11 ENCOUNTER — Ambulatory Visit: Admission: RE | Admit: 2023-04-11 | Payer: BC Managed Care – PPO | Source: Ambulatory Visit

## 2023-05-09 ENCOUNTER — Other Ambulatory Visit: Payer: Self-pay | Admitting: *Deleted

## 2023-05-09 ENCOUNTER — Inpatient Hospital Stay: Payer: BC Managed Care – PPO

## 2023-05-09 ENCOUNTER — Encounter: Payer: Self-pay | Admitting: Internal Medicine

## 2023-05-09 ENCOUNTER — Other Ambulatory Visit: Payer: Self-pay | Admitting: Internal Medicine

## 2023-05-09 ENCOUNTER — Inpatient Hospital Stay: Payer: BC Managed Care – PPO | Attending: Internal Medicine | Admitting: Internal Medicine

## 2023-05-09 VITALS — BP 140/61 | HR 65 | Temp 97.8°F | Wt 163.9 lb

## 2023-05-09 DIAGNOSIS — E1122 Type 2 diabetes mellitus with diabetic chronic kidney disease: Secondary | ICD-10-CM | POA: Insufficient documentation

## 2023-05-09 DIAGNOSIS — Z79899 Other long term (current) drug therapy: Secondary | ICD-10-CM | POA: Insufficient documentation

## 2023-05-09 DIAGNOSIS — R778 Other specified abnormalities of plasma proteins: Secondary | ICD-10-CM

## 2023-05-09 DIAGNOSIS — I25810 Atherosclerosis of coronary artery bypass graft(s) without angina pectoris: Secondary | ICD-10-CM | POA: Insufficient documentation

## 2023-05-09 DIAGNOSIS — I129 Hypertensive chronic kidney disease with stage 1 through stage 4 chronic kidney disease, or unspecified chronic kidney disease: Secondary | ICD-10-CM | POA: Insufficient documentation

## 2023-05-09 DIAGNOSIS — N183 Chronic kidney disease, stage 3 unspecified: Secondary | ICD-10-CM | POA: Insufficient documentation

## 2023-05-09 DIAGNOSIS — R634 Abnormal weight loss: Secondary | ICD-10-CM

## 2023-05-09 DIAGNOSIS — E7800 Pure hypercholesterolemia, unspecified: Secondary | ICD-10-CM | POA: Diagnosis not present

## 2023-05-09 DIAGNOSIS — R899 Unspecified abnormal finding in specimens from other organs, systems and tissues: Secondary | ICD-10-CM

## 2023-05-09 DIAGNOSIS — Z7982 Long term (current) use of aspirin: Secondary | ICD-10-CM | POA: Insufficient documentation

## 2023-05-09 DIAGNOSIS — R948 Abnormal results of function studies of other organs and systems: Secondary | ICD-10-CM

## 2023-05-09 LAB — COMPREHENSIVE METABOLIC PANEL
ALT: 13 U/L (ref 0–44)
AST: 27 U/L (ref 15–41)
Albumin: 4 g/dL (ref 3.5–5.0)
Alkaline Phosphatase: 88 U/L (ref 38–126)
Anion gap: 12 (ref 5–15)
BUN: 21 mg/dL (ref 8–23)
CO2: 27 mmol/L (ref 22–32)
Calcium: 9.4 mg/dL (ref 8.9–10.3)
Chloride: 100 mmol/L (ref 98–111)
Creatinine, Ser: 1.49 mg/dL — ABNORMAL HIGH (ref 0.44–1.00)
GFR, Estimated: 36 mL/min — ABNORMAL LOW (ref >60)
Glucose, Bld: 111 mg/dL — ABNORMAL HIGH (ref 70–99)
Potassium: 3 mmol/L — ABNORMAL LOW (ref 3.5–5.1)
Sodium: 139 mmol/L (ref 135–145)
Total Bilirubin: 1.3 mg/dL — ABNORMAL HIGH (ref 0.3–1.2)
Total Protein: 7.5 g/dL (ref 6.5–8.1)

## 2023-05-09 LAB — CBC WITH DIFFERENTIAL/PLATELET
Abs Immature Granulocytes: 0.01 10*3/uL (ref 0.00–0.07)
Basophils Absolute: 0 10*3/uL (ref 0.0–0.1)
Basophils Relative: 1 %
Eosinophils Absolute: 0.1 10*3/uL (ref 0.0–0.5)
Eosinophils Relative: 1 %
HCT: 34.7 % — ABNORMAL LOW (ref 36.0–46.0)
Hemoglobin: 11.3 g/dL — ABNORMAL LOW (ref 12.0–15.0)
Immature Granulocytes: 0 %
Lymphocytes Relative: 32 %
Lymphs Abs: 1.5 10*3/uL (ref 0.7–4.0)
MCH: 31.4 pg (ref 26.0–34.0)
MCHC: 32.6 g/dL (ref 30.0–36.0)
MCV: 96.4 fL (ref 80.0–100.0)
Monocytes Absolute: 0.5 10*3/uL (ref 0.1–1.0)
Monocytes Relative: 12 %
Neutro Abs: 2.5 10*3/uL (ref 1.7–7.7)
Neutrophils Relative %: 54 %
Platelets: 226 10*3/uL (ref 150–400)
RBC: 3.6 MIL/uL — ABNORMAL LOW (ref 3.87–5.11)
RDW: 12.4 % (ref 11.5–15.5)
WBC: 4.7 10*3/uL (ref 4.0–10.5)
nRBC: 0 % (ref 0.0–0.2)

## 2023-05-09 NOTE — Progress Notes (Signed)
[facility]  Telephone:([phone]) [phone] Fax:(336) [phone]  ID: Pam Baker OB: July 31, 1946  MR#: [id number]  QIH#:[id number]  Patient Care Team: Patrice Paradise, MD as PCP - General (Physician Assistant)  REFERRING PROVIDER: Dr. Merlinda Frederick  REASON FOR REFERRAL: Abnormal SPEP  HPI: Pam Baker is a 77 y.o. female with past medical history of hypertension, diabetes, hyperlipidemia, CAD status post CABG was referred to hematology for abnormal SPEP.  Reports history of COVID infection in September 2023.  Unintentional weight loss of about 23 pounds in past 1 year.  She denies any shortness of breath, chest pain, bleeding in urine or stools.  Denies any bone pain or neuropathy.  For weight loss, had SPEP/IFE testing with her primary.  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  CBC showed hemoglobin 11.3, WBC 4.7 and platelet 226.  Creatinine 1.49.  Has history of CKD stage III since 2021.  Creatinine ranging between 1.3-1.4.   REVIEW OF SYSTEMS:   ROS  As per HPI. Otherwise, a complete review of systems is negative.  PAST MEDICAL HISTORY: Past Medical History:  Diagnosis Date   Colon polyps    personal history, adenomatous   Coronary atherosclerosis of autologous vein bypass graft    Coronary atherosclerosis of native coronary artery    Diabetes mellitus type 2, uncomplicated (HCC)    diet-controlled   Hyperlipidemia    Hypertension    Postmenopausal    Pure hypercholesterolemia     PAST SURGICAL HISTORY: Past Surgical History:  Procedure Laterality Date   ABDOMINAL HYSTERECTOMY  05/2001   TAH-BSO secondary to postmenopausal bleeding and uterine fibroids   APPENDECTOMY     BREAST BIOPSY Bilateral    1980's, benign per pt   BREAST EXCISIONAL BIOPSY Bilateral 92 and 82   CHOLECYSTECTOMY     COLONOSCOPY WITH PROPOFOL N/A 11/07/2015   Procedure: COLONOSCOPY WITH PROPOFOL;  Surgeon: Christena Deem, MD;  Location: [facility]  ENDOSCOPY;  Service: Endoscopy;  Laterality: N/A;   COLONOSCOPY WITH PROPOFOL N/A 03/16/2021   Procedure: COLONOSCOPY WITH PROPOFOL;  Surgeon: Regis Bill, MD;  Location: [facility] ENDOSCOPY;  Service: Endoscopy;  Laterality: N/A;   CORONARY ARTERY BYPASS GRAFT  12/2007   single vessel CABG surgery    FAMILY HISTORY: Family History  Problem Relation Age of Onset   Breast cancer Neg Hx     HEALTH MAINTENANCE: Social History   Tobacco Use   Smoking status: Never   Smokeless tobacco: Never  Substance Use Topics   Alcohol use: No   Drug use: No     Allergies  Allergen Reactions   Amoxicillin    Lisinopril     Current Outpatient Medications  Medication Sig Dispense Refill   aspirin 81 MG tablet Take 81 mg by mouth daily.     atorvastatin (LIPITOR) 20 MG tablet Take 20 mg by mouth daily.     Calcium Carbonate-Vit D-Min (CALCIUM 600 + MINERALS) 600-200 MG-UNIT TABS Take by mouth.     losartan-hydrochlorothiazide (HYZAAR) 50-12.5 MG tablet Take 1 tablet by mouth daily.     metoprolol succinate (TOPROL-XL) 100 MG 24 hr tablet Take 100 mg by mouth daily. Take with or immediately following a meal.     No current facility-administered medications for this visit.    OBJECTIVE: Vitals:   05/09/23 1136  BP: (!) 140/61  Pulse: 65  Temp: 97.8 F (36.6 C)  SpO2: 99%     Body mass index is 30.97 kg/m.  General: Well-developed, well-nourished, no acute distress. Eyes: Pink conjunctiva, anicteric sclera. HEENT: Normocephalic, moist mucous membranes, clear oropharnyx. Lungs: Clear to auscultation bilaterally. Heart: Regular rate and rhythm. No rubs, murmurs, or gallops. Abdomen: Soft, nontender, nondistended. No organomegaly noted, normoactive bowel sounds. Musculoskeletal: No edema, cyanosis, or clubbing. Neuro: Alert, answering all questions appropriately. Cranial nerves grossly intact. Skin: No rashes or petechiae noted. Psych: Normal affect. Lymphatics: No cervical,  calvicular, axillary or inguinal LAD.   LAB RESULTS:  No results found for: "NA", "K", "CL", "CO2", "GLUCOSE", "BUN", "CREATININE", "CALCIUM", "PROT", "ALBUMIN", "AST", "ALT", "ALKPHOS", "BILITOT", "GFRNONAA", "GFRAA"  No results found for: "WBC", "NEUTROABS", "HGB", "HCT", "MCV", "PLT"  No results found for: "TIBC", "FERRITIN", "IRONPCTSAT"   STUDIES: No results found.  ASSESSMENT AND PLAN:   Pam Baker is a 77 y.o. female with pmh of hypertension, diabetes, hyperlipidemia, CAD status post CABG was referred to hematology for abnormal SPEP.  # Abnormal SPEP # Unintentional weight loss -Reports weight loss of about 23 pounds in the past 1 year. For weight loss, had SPEP/IFE testing with her primary.  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  CBC showed hemoglobin 11.3, WBC 4.7 and platelet 226.  Creatinine 1.49.  Has history of CKD stage III since 2021.  Creatinine ranging between 1.3-1.4.  -Will obtain full myeloma panel today.  With her history of unexplained weight loss would also consider CT chest abdomen pelvis to rule out any malignancy process.  Patient had CT scan scheduled with her primary previously but could not obtain it due to high co-pay.  Will reattempt with our authorization staff if it is covered by her insurance.  Orders Placed This Encounter  Procedures   Kappa/lambda light chains   Multiple Myeloma Panel (SPEP&IFE w/QIG)   Immunofixation, urine   CBC with Differential/Platelet   Comprehensive metabolic panel   RTC in 1 month for MD visit to discuss  Patient expressed understanding and was in agreement with this plan. She also understands that She can call clinic at any time with any questions, concerns, or complaints.   I spent a total of 45 minutes reviewing chart data, face-to-face evaluation with the patient, counseling and coordination of care as detailed above.  Michaelyn Barter, MD   05/09/2023 11:43 AM

## 2023-05-10 LAB — KAPPA/LAMBDA LIGHT CHAINS
Kappa free light chain: 53.6 mg/L — ABNORMAL HIGH (ref 3.3–19.4)
Kappa, lambda light chain ratio: 2.41 — ABNORMAL HIGH (ref 0.26–1.65)
Lambda free light chains: 22.2 mg/L (ref 5.7–26.3)

## 2023-05-11 LAB — IMMUNOFIXATION, URINE

## 2023-05-13 ENCOUNTER — Ambulatory Visit: Payer: BC Managed Care – PPO

## 2023-05-13 ENCOUNTER — Other Ambulatory Visit: Payer: Self-pay | Admitting: *Deleted

## 2023-05-13 DIAGNOSIS — R778 Other specified abnormalities of plasma proteins: Secondary | ICD-10-CM

## 2023-05-13 DIAGNOSIS — R769 Abnormal immunological finding in serum, unspecified: Secondary | ICD-10-CM

## 2023-05-13 LAB — MULTIPLE MYELOMA PANEL, SERUM
Albumin SerPl Elph-Mcnc: 3.4 g/dL (ref 2.9–4.4)
Albumin/Glob SerPl: 1.1 (ref 0.7–1.7)
Alpha 1: 0.3 g/dL (ref 0.0–0.4)
Alpha2 Glob SerPl Elph-Mcnc: 0.7 g/dL (ref 0.4–1.0)
B-Globulin SerPl Elph-Mcnc: 0.9 g/dL (ref 0.7–1.3)
Gamma Glob SerPl Elph-Mcnc: 1.4 g/dL (ref 0.4–1.8)
Globulin, Total: 3.3 g/dL (ref 2.2–3.9)
IgA: 297 mg/dL (ref 64–422)
IgG (Immunoglobin G), Serum: 1168 mg/dL (ref 586–1602)
IgM (Immunoglobulin M), Srm: 477 mg/dL — ABNORMAL HIGH (ref 26–217)
M Protein SerPl Elph-Mcnc: 0.6 g/dL — ABNORMAL HIGH
Total Protein ELP: 6.7 g/dL (ref 6.0–8.5)

## 2023-05-18 ENCOUNTER — Ambulatory Visit
Admission: RE | Admit: 2023-05-18 | Discharge: 2023-05-18 | Disposition: A | Discharge status: Home / Self Care | Payer: BC Managed Care – PPO | Source: Ambulatory Visit | Attending: Internal Medicine | Admitting: Internal Medicine

## 2023-05-18 DIAGNOSIS — R778 Other specified abnormalities of plasma proteins: Secondary | ICD-10-CM | POA: Insufficient documentation

## 2023-05-18 DIAGNOSIS — R769 Abnormal immunological finding in serum, unspecified: Secondary | ICD-10-CM | POA: Diagnosis present

## 2023-06-06 ENCOUNTER — Inpatient Hospital Stay: Payer: BC Managed Care – PPO | Attending: Internal Medicine | Admitting: Internal Medicine

## 2023-06-06 VITALS — BP 146/55 | HR 70 | Temp 98.1°F | Wt 163.0 lb

## 2023-06-06 DIAGNOSIS — I129 Hypertensive chronic kidney disease with stage 1 through stage 4 chronic kidney disease, or unspecified chronic kidney disease: Secondary | ICD-10-CM | POA: Diagnosis not present

## 2023-06-06 DIAGNOSIS — N183 Chronic kidney disease, stage 3 unspecified: Secondary | ICD-10-CM | POA: Insufficient documentation

## 2023-06-06 DIAGNOSIS — Z79899 Other long term (current) drug therapy: Secondary | ICD-10-CM | POA: Diagnosis not present

## 2023-06-06 DIAGNOSIS — R911 Solitary pulmonary nodule: Secondary | ICD-10-CM | POA: Diagnosis not present

## 2023-06-06 DIAGNOSIS — R634 Abnormal weight loss: Secondary | ICD-10-CM | POA: Diagnosis not present

## 2023-06-06 DIAGNOSIS — Z7982 Long term (current) use of aspirin: Secondary | ICD-10-CM | POA: Diagnosis not present

## 2023-06-06 DIAGNOSIS — D472 Monoclonal gammopathy: Secondary | ICD-10-CM | POA: Insufficient documentation

## 2023-06-06 DIAGNOSIS — E1122 Type 2 diabetes mellitus with diabetic chronic kidney disease: Secondary | ICD-10-CM | POA: Diagnosis not present

## 2023-06-06 DIAGNOSIS — R778 Other specified abnormalities of plasma proteins: Secondary | ICD-10-CM | POA: Diagnosis not present

## 2023-06-06 NOTE — Progress Notes (Unsigned)
[facility]  Telephone:(336) [phone] Fax:(336) [phone]  ID: Pam Baker OB: January 16, 1946  MR#: [id number]  EXB#:[id number]  Patient Care Team: Patrice Paradise, MD as PCP - General (Physician Assistant) Michaelyn Barter, MD as Consulting Physician (Oncology)  REASON FOR REFERRAL: Abnormal SPEP  HPI: Pam Baker is a 77 y.o. female with past medical history of hypertension, diabetes, hyperlipidemia, CAD status post CABG was referred to hematology for abnormal SPEP.  Reports history of COVID infection in September 2023.  Unintentional weight loss of about 23 pounds in past 1 year.  She denies any shortness of breath, chest pain, bleeding in urine or stools.  Denies any bone pain or neuropathy.  For weight loss, had SPEP/IFE testing with her primary.  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  CBC showed hemoglobin 11.3, WBC 4.7 and platelet 226.  Creatinine 1.49.  Has history of CKD stage III since 2021.  Creatinine ranging between 1.3-1.4.  Interval history Patient was seen today as follow-up for labs and to discuss CT report. She has been feeling well overall.  Denies any new changes.  Weight has stabilized.  REVIEW OF SYSTEMS:   ROS  As per HPI. Otherwise, a complete review of systems is negative.  PAST MEDICAL HISTORY: Past Medical History:  Diagnosis Date  . Colon polyps    personal history, adenomatous  . Coronary atherosclerosis of autologous vein bypass graft   . Coronary atherosclerosis of native coronary artery   . Diabetes mellitus type 2, uncomplicated (HCC)    diet-controlled  . Hyperlipidemia   . Hypertension   . Postmenopausal   . Pure hypercholesterolemia     PAST SURGICAL HISTORY: Past Surgical History:  Procedure Laterality Date  . ABDOMINAL HYSTERECTOMY  05/2001   TAH-BSO secondary to postmenopausal bleeding and uterine fibroids  . APPENDECTOMY    . BREAST BIOPSY Bilateral    1980's, benign per  pt  . BREAST EXCISIONAL BIOPSY Bilateral 92 and 82  . CHOLECYSTECTOMY    . COLONOSCOPY WITH PROPOFOL N/A 11/07/2015   Procedure: COLONOSCOPY WITH PROPOFOL;  Surgeon: Christena Deem, MD;  Location: [facility] ENDOSCOPY;  Service: Endoscopy;  Laterality: N/A;  . COLONOSCOPY WITH PROPOFOL N/A 03/16/2021   Procedure: COLONOSCOPY WITH PROPOFOL;  Surgeon: Regis Bill, MD;  Location: [facility] ENDOSCOPY;  Service: Endoscopy;  Laterality: N/A;  . CORONARY ARTERY BYPASS GRAFT  12/2007   single vessel CABG surgery    FAMILY HISTORY: Family History  Problem Relation Age of Onset  . Breast cancer Neg Hx     HEALTH MAINTENANCE: Social History   Tobacco Use  . Smoking status: Never  . Smokeless tobacco: Never  Substance Use Topics  . Alcohol use: No  . Drug use: No     Allergies  Allergen Reactions  . Amoxicillin   . Lisinopril     Current Outpatient Medications  Medication Sig Dispense Refill  . aspirin 81 MG tablet Take 81 mg by mouth daily.    Marland Kitchen atorvastatin (LIPITOR) 20 MG tablet Take 20 mg by mouth daily.    . Calcium Carbonate-Vit D-Min (CALCIUM 600 + MINERALS) 600-200 MG-UNIT TABS Take by mouth.    . losartan-hydrochlorothiazide (HYZAAR) 50-12.5 MG tablet Take 1 tablet by mouth daily.    . metoprolol succinate (TOPROL-XL) 100 MG 24 hr tablet Take 100 mg by mouth daily. Take with or immediately following a meal.     No current facility-administered medications for this visit.    OBJECTIVE: Vitals:  06/06/23 1032  BP: (!) 146/55  Pulse: 70  Temp: 98.1 F (36.7 C)  SpO2: 100%     Body mass index is 30.8 kg/m.      General: Well-developed, well-nourished, no acute distress. Eyes: Pink conjunctiva, anicteric sclera. HEENT: Normocephalic, moist mucous membranes, clear oropharnyx. Lungs: Clear to auscultation bilaterally. Heart: Regular rate and rhythm. No rubs, murmurs, or gallops. Abdomen: Soft, nontender, nondistended. No organomegaly noted, normoactive bowel  sounds. Musculoskeletal: No edema, cyanosis, or clubbing. Neuro: Alert, answering all questions appropriately. Cranial nerves grossly intact. Skin: No rashes or petechiae noted. Psych: Normal affect. Lymphatics: No cervical, calvicular, axillary or inguinal LAD.   LAB RESULTS:  Lab Results  Component Value Date   NA 139 05/09/2023   K 3.0 (L) 05/09/2023   CL 100 05/09/2023   CO2 27 05/09/2023   GLUCOSE 111 (H) 05/09/2023   BUN 21 05/09/2023   CREATININE 1.49 (H) 05/09/2023   CALCIUM 9.4 05/09/2023   PROT 7.5 05/09/2023   ALBUMIN 4.0 05/09/2023   AST 27 05/09/2023   ALT 13 05/09/2023   ALKPHOS 88 05/09/2023   BILITOT 1.3 (H) 05/09/2023   GFRNONAA 36 (L) 05/09/2023    Lab Results  Component Value Date   WBC 4.7 05/09/2023   NEUTROABS 2.5 05/09/2023   HGB 11.3 (L) 05/09/2023   HCT 34.7 (L) 05/09/2023   MCV 96.4 05/09/2023   PLT 226 05/09/2023    No results found for: "TIBC", "FERRITIN", "IRONPCTSAT"   STUDIES: CT ABDOMEN PELVIS WO CONTRAST  Result Date: 06/06/2023 CLINICAL DATA:  Abnormal labs. EXAM: CT ABDOMEN AND PELVIS WITHOUT CONTRAST TECHNIQUE: Multidetector CT imaging of the abdomen and pelvis was performed following the standard protocol without IV contrast. RADIATION DOSE REDUCTION: This exam was performed according to the departmental dose-optimization program which includes automated exposure control, adjustment of the mA and/or kV according to patient size and/or use of iterative reconstruction technique. COMPARISON:  None Available. FINDINGS: Lower chest: Linear bibasilar scarring or subsegmental atelectasis. 6 mm nodule left lung base laterally. Noncontrast chest CT recommended to evaluate for additional lesions. Hepatobiliary: No focal liver abnormality is seen. Status post cholecystectomy. No biliary dilatation. Pancreas: Unremarkable. No pancreatic ductal dilatation or surrounding inflammatory changes. Spleen: Normal in size without focal abnormality.  Adrenals/Urinary Tract: Adrenal glands are unremarkable. Kidneys are normal, without renal calculi, focal lesion, or hydronephrosis. Bladder is unremarkable. Stomach/Bowel: Stomach is within normal limits. Appendix has been removed. No evidence of bowel wall thickening, distention, or inflammatory changes. Vascular/Lymphatic: Aortic atherosclerosis. No enlarged abdominal or pelvic lymph nodes. Reproductive: Status post hysterectomy. No adnexal masses. Other: No abdominal wall hernia or abnormality. No abdominopelvic ascites. Musculoskeletal: No acute or significant osseous findings. IMPRESSION: 1. Left lung base 6 mm nodule. Consider noncontrast CT to evaluate for additional lesions and 6-12 month CT follow up. 2. Otherwise no acute abdominal or pelvic pathology. Electronically Signed   By: Layla Maw M.D.   On: 06/06/2023 08:29    ASSESSMENT AND PLAN:   Pam Baker is a 77 y.o. female with pmh of hypertension, diabetes, hyperlipidemia, CAD status post CABG was referred to hematology for abnormal SPEP.  # IgM MGUS -SPEP/IFE was performed by her primary for weight loss.  Showed 0.5 g/dL IgM kappa paraprotein. -Repeat labs showed 0.6 g/dL IgM kappa protein, kappa 53.6, lambda 22.2, ratio 2.41.  Urine immunofixation no M protein.  Hemoglobin 11.3 which is stable.  Calcium is normal.  Creatinine 1.4  -Reports weight loss of about 23 pounds in  the past 1 year. For weight loss, had SPEP/IFE testing with her primary.  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  CBC showed hemoglobin 11.3, WBC 4.7 and platelet 226.  Creatinine 1.49.  Has history of CKD stage III since 2021.  Creatinine ranging between 1.3-1.4.  -Will obtain full myeloma panel today.  With her history of unexplained weight loss would also consider CT chest abdomen pelvis to rule out any malignancy process.  Patient had CT scan scheduled with her primary previously but could not obtain it due to high co-pay.  Will  reattempt with our authorization staff if it is covered by her insurance.  Orders Placed This Encounter  Procedures  . CT Chest Wo Contrast  . CBC with Differential/Platelet  . CMP (Cancer Center only)  . Immunofixation, urine  . Multiple Myeloma Panel (SPEP&IFE w/QIG)  . Kappa/lambda light chains   RTC in 1 month for MD visit to discuss  Patient expressed understanding and was in agreement with this plan. She also understands that She can call clinic at any time with any questions, concerns, or complaints.   I spent a total of 45 minutes reviewing chart data, face-to-face evaluation with the patient, counseling and coordination of care as detailed above.  Michaelyn Barter, MD   06/06/2023 1:12 PM

## 2023-06-06 NOTE — Progress Notes (Unsigned)
Patient had a CT scan on 05/18/2023.

## 2023-06-07 DIAGNOSIS — D472 Monoclonal gammopathy: Secondary | ICD-10-CM | POA: Insufficient documentation

## 2023-06-07 DIAGNOSIS — R911 Solitary pulmonary nodule: Secondary | ICD-10-CM | POA: Insufficient documentation

## 2023-10-04 ENCOUNTER — Encounter: Payer: Self-pay | Admitting: Physician Assistant

## 2023-10-06 ENCOUNTER — Telehealth: Payer: Self-pay | Admitting: *Deleted

## 2023-10-06 DIAGNOSIS — R634 Abnormal weight loss: Secondary | ICD-10-CM

## 2023-10-06 DIAGNOSIS — R778 Other specified abnormalities of plasma proteins: Secondary | ICD-10-CM

## 2023-10-06 DIAGNOSIS — D649 Anemia, unspecified: Secondary | ICD-10-CM

## 2023-10-06 DIAGNOSIS — D472 Monoclonal gammopathy: Secondary | ICD-10-CM

## 2023-10-06 NOTE — Telephone Encounter (Signed)
 Per Dr. Mervyn Skeeters - after review of chart - we need to move the ct scan up to next week or so and also schedule a labs next week. See DR. A in about 2 weeks for the results. I have attempted to reach patient to discuss pan of care, but no answer.  Dr. Mervyn Skeeters would like to order a b12/folate and iron and ferritin labs in addition to her already myeloma work up scheduled labs

## 2023-10-06 NOTE — Telephone Encounter (Signed)
-----   Message from Michaelyn Barter sent at 10/04/2023  3:03 PM EST ----- Regarding: RE: REFERRAL- [facility] INTERNAL MEDICINE I have already seen her. Keep follow up as scheduled. ----- Message ----- From: Reggy Eye Sent: 10/04/2023   2:05 PM EST To: Keitha Butte, RN; Otelia Sergeant; # Subject: REFERRAL- [facility]            REFERRAL- [facility] INTERNAL MEDICINE sent to her chart.Pam Baker is referring her back for unintended weight loss & abnormal SPEP. If I'm not mistaken I thought you were already seeing her for that. He next appt is 4/11 for Lab/CT & then see Dr Alena Bills on 4/21.Please advise if she needs to come sooner.

## 2023-10-11 NOTE — Telephone Encounter (Signed)
 Attempted to reach patient at home number-I called pt's husband- Redell at [phone].  Per husband, pt not at home, but would be home later this evening. I asked patient to call back tomorrow and speak to our scheduling team to go over all her apts lab and ct and md apts.

## 2023-10-19 ENCOUNTER — Ambulatory Visit
Admission: RE | Admit: 2023-10-19 | Discharge: 2023-10-19 | Disposition: A | Discharge status: Home / Self Care | Payer: 59 | Source: Ambulatory Visit | Attending: Internal Medicine | Admitting: Internal Medicine

## 2023-10-19 ENCOUNTER — Inpatient Hospital Stay: Payer: Self-pay | Attending: Internal Medicine

## 2023-10-19 DIAGNOSIS — R911 Solitary pulmonary nodule: Secondary | ICD-10-CM | POA: Diagnosis present

## 2023-10-19 DIAGNOSIS — Z79899 Other long term (current) drug therapy: Secondary | ICD-10-CM | POA: Insufficient documentation

## 2023-10-19 DIAGNOSIS — E1122 Type 2 diabetes mellitus with diabetic chronic kidney disease: Secondary | ICD-10-CM | POA: Diagnosis not present

## 2023-10-19 DIAGNOSIS — E7800 Pure hypercholesterolemia, unspecified: Secondary | ICD-10-CM | POA: Insufficient documentation

## 2023-10-19 DIAGNOSIS — C9000 Multiple myeloma not having achieved remission: Secondary | ICD-10-CM | POA: Insufficient documentation

## 2023-10-19 DIAGNOSIS — N183 Chronic kidney disease, stage 3 unspecified: Secondary | ICD-10-CM | POA: Diagnosis not present

## 2023-10-19 DIAGNOSIS — I129 Hypertensive chronic kidney disease with stage 1 through stage 4 chronic kidney disease, or unspecified chronic kidney disease: Secondary | ICD-10-CM | POA: Insufficient documentation

## 2023-10-19 DIAGNOSIS — R778 Other specified abnormalities of plasma proteins: Secondary | ICD-10-CM

## 2023-10-19 DIAGNOSIS — D892 Hypergammaglobulinemia, unspecified: Secondary | ICD-10-CM | POA: Insufficient documentation

## 2023-10-19 DIAGNOSIS — D649 Anemia, unspecified: Secondary | ICD-10-CM | POA: Diagnosis not present

## 2023-10-19 LAB — CBC WITH DIFFERENTIAL/PLATELET
Abs Immature Granulocytes: 0.01 10*3/uL (ref 0.00–0.07)
Basophils Absolute: 0 10*3/uL (ref 0.0–0.1)
Basophils Relative: 1 %
Eosinophils Absolute: 0.1 10*3/uL (ref 0.0–0.5)
Eosinophils Relative: 1 %
HCT: 30.4 % — ABNORMAL LOW (ref 36.0–46.0)
Hemoglobin: 10.2 g/dL — ABNORMAL LOW (ref 12.0–15.0)
Immature Granulocytes: 0 %
Lymphocytes Relative: 42 %
Lymphs Abs: 1.9 10*3/uL (ref 0.7–4.0)
MCH: 32.2 pg (ref 26.0–34.0)
MCHC: 33.6 g/dL (ref 30.0–36.0)
MCV: 95.9 fL (ref 80.0–100.0)
Monocytes Absolute: 0.5 10*3/uL (ref 0.1–1.0)
Monocytes Relative: 11 %
Neutro Abs: 2 10*3/uL (ref 1.7–7.7)
Neutrophils Relative %: 45 %
Platelets: 200 10*3/uL (ref 150–400)
RBC: 3.17 MIL/uL — ABNORMAL LOW (ref 3.87–5.11)
RDW: 12.7 % (ref 11.5–15.5)
WBC: 4.5 10*3/uL (ref 4.0–10.5)
nRBC: 0 % (ref 0.0–0.2)

## 2023-10-19 LAB — IRON AND TIBC
Iron: 70 ug/dL (ref 28–170)
Saturation Ratios: 24 % (ref 10.4–31.8)
TIBC: 287 ug/dL (ref 250–450)
UIBC: 217 ug/dL

## 2023-10-19 LAB — CMP (CANCER CENTER ONLY)
ALT: 14 U/L (ref 0–44)
AST: 25 U/L (ref 15–41)
Albumin: 4.1 g/dL (ref 3.5–5.0)
Alkaline Phosphatase: 79 U/L (ref 38–126)
Anion gap: 8 (ref 5–15)
BUN: 15 mg/dL (ref 8–23)
CO2: 27 mmol/L (ref 22–32)
Calcium: 9.3 mg/dL (ref 8.9–10.3)
Chloride: 99 mmol/L (ref 98–111)
Creatinine: 1.38 mg/dL — ABNORMAL HIGH (ref 0.44–1.00)
GFR, Estimated: 39 mL/min — ABNORMAL LOW (ref >60)
Glucose, Bld: 98 mg/dL (ref 70–99)
Potassium: 3.4 mmol/L — ABNORMAL LOW (ref 3.5–5.1)
Sodium: 134 mmol/L — ABNORMAL LOW (ref 135–145)
Total Bilirubin: 1.5 mg/dL — ABNORMAL HIGH (ref 0.0–1.2)
Total Protein: 7.1 g/dL (ref 6.5–8.1)

## 2023-10-19 LAB — VITAMIN B12: Vitamin B-12: 486 pg/mL (ref 180–914)

## 2023-10-19 LAB — FOLATE: Folate: >40 ng/mL (ref >5.9)

## 2023-10-19 LAB — FERRITIN: Ferritin: 247 ng/mL (ref 11–307)

## 2023-10-20 LAB — KAPPA/LAMBDA LIGHT CHAINS
Kappa free light chain: 50 mg/L — ABNORMAL HIGH (ref 3.3–19.4)
Kappa, lambda light chain ratio: 2.67 — ABNORMAL HIGH (ref 0.26–1.65)
Lambda free light chains: 18.7 mg/L (ref 5.7–26.3)

## 2023-10-25 LAB — MULTIPLE MYELOMA PANEL, SERUM
Albumin SerPl Elph-Mcnc: 3.6 g/dL (ref 2.9–4.4)
Albumin/Glob SerPl: 1.2 (ref 0.7–1.7)
Alpha 1: 0.3 g/dL (ref 0.0–0.4)
Alpha2 Glob SerPl Elph-Mcnc: 0.7 g/dL (ref 0.4–1.0)
B-Globulin SerPl Elph-Mcnc: 0.9 g/dL (ref 0.7–1.3)
Gamma Glob SerPl Elph-Mcnc: 1.3 g/dL (ref 0.4–1.8)
Globulin, Total: 3.1 g/dL (ref 2.2–3.9)
IgA: 242 mg/dL (ref 64–422)
IgG (Immunoglobin G), Serum: 1051 mg/dL (ref 586–1602)
IgM (Immunoglobulin M), Srm: 364 mg/dL — ABNORMAL HIGH (ref 26–217)
Total Protein ELP: 6.7 g/dL (ref 6.0–8.5)

## 2023-11-04 ENCOUNTER — Telehealth: Payer: Self-pay | Admitting: *Deleted

## 2023-11-04 ENCOUNTER — Encounter: Payer: Self-pay | Admitting: Internal Medicine

## 2023-11-04 ENCOUNTER — Inpatient Hospital Stay: Payer: Self-pay | Admitting: Internal Medicine

## 2023-11-04 VITALS — BP 138/58 | HR 75 | Temp 96.9°F | Resp 14 | Wt 139.0 lb

## 2023-11-04 DIAGNOSIS — R634 Abnormal weight loss: Secondary | ICD-10-CM

## 2023-11-04 DIAGNOSIS — D649 Anemia, unspecified: Secondary | ICD-10-CM

## 2023-11-04 DIAGNOSIS — D472 Monoclonal gammopathy: Secondary | ICD-10-CM

## 2023-11-04 DIAGNOSIS — D631 Anemia in chronic kidney disease: Secondary | ICD-10-CM | POA: Insufficient documentation

## 2023-11-04 DIAGNOSIS — C9000 Multiple myeloma not having achieved remission: Secondary | ICD-10-CM | POA: Diagnosis not present

## 2023-11-04 NOTE — Progress Notes (Signed)
 Patient's appetite has been on and off ongoing for about a year now, ever since she had covid-19, she tries to drink at least one of the Ensure drinks a day to help. She has lost over 30 lbs since her last visit. She did have a CT scan on 10/19/2023.

## 2023-11-04 NOTE — Progress Notes (Signed)
 [facility]  Telephone:(336) [phone] Fax:(336) [phone]  ID: Pam Baker OB: 06/10/1946  MR#: [id number]  GYI#:[id number]  Patient Care Team: Patrice Paradise, MD as PCP - General (Physician Assistant) Michaelyn Barter, MD as Consulting Physician (Oncology)  REASON FOR REFERRAL: Abnormal SPEP, unintentional weight loss  HPI: Pam Baker is a 78 y.o. female with past medical history of hypertension, diabetes, hyperlipidemia, CAD status post CABG was referred to hematology for abnormal SPEP.  Reports history of COVID infection in September 2023.  Unintentional weight loss of about 23 pounds in past 1 year.  She denies any shortness of breath, chest pain, bleeding in urine or stools.  Denies any bone pain or neuropathy.  For weight loss, had SPEP/IFE testing with her primary.  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  CBC showed hemoglobin 11.3, WBC 4.7 and platelet 226.  Creatinine 1.49.  Has history of CKD stage III since 2021.  Creatinine ranging between 1.3-1.4.  CT abdomen pelvis from 05/2023 showed 6 mm left lung base nodule.  No concerning abdominal findings. CT chest from 10/2023 showed 6 mm left lung nodule which is likely due to scarring or a lymph node.  No concerns for malignancy Up to date with mammogram Colonoscopy from August 2022 with Dr. Mia Creek showed multiple polyps.  Pathology tubular adenoma.  Future colonoscopy was not recommended due to age.  Interval history Patient was seen today as follow-up to discuss labs, CT imaging. Patient's primary care doctor reached out to [place].  Since the last visit about 5 months ago, patient has lost 30 more pounds.  Patient tells me that her trousers size has gone down from 18-12.  No appetite whatsoever.  Denies any hot flashes or night sweats.  REVIEW OF SYSTEMS:   ROS  As per HPI. Otherwise, a complete review of systems is negative.  PAST MEDICAL HISTORY: Past Medical  History:  Diagnosis Date   Colon polyps    personal history, adenomatous   Coronary atherosclerosis of autologous vein bypass graft    Coronary atherosclerosis of native coronary artery    Diabetes mellitus type 2, uncomplicated (HCC)    diet-controlled   Hyperlipidemia    Hypertension    Postmenopausal    Pure hypercholesterolemia     PAST SURGICAL HISTORY: Past Surgical History:  Procedure Laterality Date   ABDOMINAL HYSTERECTOMY  05/2001   TAH-BSO secondary to postmenopausal bleeding and uterine fibroids   APPENDECTOMY     BREAST BIOPSY Bilateral    1980's, benign per pt   BREAST EXCISIONAL BIOPSY Bilateral 92 and 82   CHOLECYSTECTOMY     COLONOSCOPY WITH PROPOFOL N/A 11/07/2015   Procedure: COLONOSCOPY WITH PROPOFOL;  Surgeon: Christena Deem, MD;  Location: [facility] ENDOSCOPY;  Service: Endoscopy;  Laterality: N/A;   COLONOSCOPY WITH PROPOFOL N/A 03/16/2021   Procedure: COLONOSCOPY WITH PROPOFOL;  Surgeon: Regis Bill, MD;  Location: [facility] ENDOSCOPY;  Service: Endoscopy;  Laterality: N/A;   CORONARY ARTERY BYPASS GRAFT  12/2007   single vessel CABG surgery    FAMILY HISTORY: Family History  Problem Relation Age of Onset   Breast cancer Neg Hx     HEALTH MAINTENANCE: Social History   Tobacco Use   Smoking status: Never   Smokeless tobacco: Never  Substance Use Topics   Alcohol use: No   Drug use: No     Allergies  Allergen Reactions   Amoxicillin    Lisinopril     Current Outpatient Medications  Medication Sig Dispense Refill   aspirin 81 MG tablet Take 81 mg by mouth daily.     atorvastatin (LIPITOR) 20 MG tablet Take 20 mg by mouth daily.     Calcium Carbonate-Vit D-Min (CALCIUM 600 + MINERALS) 600-200 MG-UNIT TABS Take by mouth.     losartan-hydrochlorothiazide (HYZAAR) 50-12.5 MG tablet Take 1 tablet by mouth daily.     metoprolol succinate (TOPROL-XL) 100 MG 24 hr tablet Take 100 mg by mouth daily. Take with or immediately following a meal.      No current facility-administered medications for this visit.    OBJECTIVE: Vitals:   11/04/23 1316  BP: (!) 138/58  Pulse: 75  Resp: 14  Temp: (!) 96.9 F (36.1 C)  SpO2: 98%     Body mass index is 26.26 kg/m.      General: Well-developed, well-nourished, no acute distress. Eyes: Pink conjunctiva, anicteric sclera. HEENT: Normocephalic, moist mucous membranes, clear oropharnyx. Lungs: Clear to auscultation bilaterally. Heart: Regular rate and rhythm. No rubs, murmurs, or gallops. Abdomen: Soft, nontender, nondistended. No organomegaly noted, normoactive bowel sounds. Musculoskeletal: No edema, cyanosis, or clubbing. Neuro: Alert, answering all questions appropriately. Cranial nerves grossly intact. Skin: No rashes or petechiae noted. Psych: Normal affect. Lymphatics: No cervical, calvicular, axillary or inguinal LAD.   LAB RESULTS:  Lab Results  Component Value Date   NA 134 (L) 10/19/2023   K 3.4 (L) 10/19/2023   CL 99 10/19/2023   CO2 27 10/19/2023   GLUCOSE 98 10/19/2023   BUN 15 10/19/2023   CREATININE 1.38 (H) 10/19/2023   CALCIUM 9.3 10/19/2023   PROT 7.1 10/19/2023   ALBUMIN 4.1 10/19/2023   AST 25 10/19/2023   ALT 14 10/19/2023   ALKPHOS 79 10/19/2023   BILITOT 1.5 (H) 10/19/2023   GFRNONAA 39 (L) 10/19/2023    Lab Results  Component Value Date   WBC 4.5 10/19/2023   NEUTROABS 2.0 10/19/2023   HGB 10.2 (L) 10/19/2023   HCT 30.4 (L) 10/19/2023   MCV 95.9 10/19/2023   PLT 200 10/19/2023    Lab Results  Component Value Date   TIBC 287 10/19/2023   FERRITIN 247 10/19/2023   IRONPCTSAT 24 10/19/2023     STUDIES: CT Chest Wo Contrast Result Date: 10/31/2023 CLINICAL DATA:  Lung nodule. EXAM: CT CHEST WITHOUT CONTRAST TECHNIQUE: Multidetector CT imaging of the chest was performed following the standard protocol without IV contrast. RADIATION DOSE REDUCTION: This exam was performed according to the departmental dose-optimization program which  includes automated exposure control, adjustment of the mA and/or kV according to patient size and/or use of iterative reconstruction technique. COMPARISON:  CT abdomen pelvis 05/18/2023. FINDINGS: Cardiovascular: Atherosclerotic calcification of the aorta. Enlarged pulmonic trunk and heart. No pericardial effusion. Mediastinum/Nodes: No pathologically enlarged mediastinal or axillary lymph nodes. Hilar regions are difficult to definitively evaluate without IV contrast. Air in the esophagus can be seen with dysmotility. Lungs/Pleura: Scattered pulmonary parenchymal scarring. Previously described 6 mm nodule in the lateral left lung base is due to scarring or a benign juxtapleural lymph node. No suspicious pulmonary nodules. No pleural fluid. Airway is unremarkable. Upper Abdomen: Cholecystectomy. Visualized portions of the liver, adrenal glands, kidneys, spleen, pancreas, stomach and bowel are grossly unremarkable. No upper abdominal adenopathy. Musculoskeletal: Degenerative changes in the spine. IMPRESSION: 1. No suspicious pulmonary nodules requiring follow-up. 2.  Aortic atherosclerosis (ICD10-I70.0). 3. Enlarged pulmonic trunk, indicative of pulmonary arterial hypertension. Electronically Signed   By: Leanna Battles M.D.   On: 10/31/2023 12:27  ASSESSMENT AND PLAN:   Pam Baker is a 78 y.o. female with pmh of hypertension, diabetes, hyperlipidemia, CAD status post CABG was referred to hematology for abnormal SPEP.  # IgM kappa MGUS -SPEP/IFE was performed by her primary for weight loss.  Showed 0.5 g/dL IgM kappa paraprotein.  -Repeat labs reviewed.  Patient has mild anemia with hemoglobin 10.2.  WBC and platelets are normal.  Iron panel/B12/folate normal.  SPEP/IFE showed IgM kappa paraproteinemia.  On M spike, not observed however protein detected on immunofixation.  Kappa 50, lambda 18.7 with ratio of 2.67 which is largely unchanged from 5 months ago.  I discussed with the patient that  presence of IgM paraproteinemia can increase the risk of lymphoproliferative disorder.  However, her myeloma labs have been unchanged in the past 5 months.  She is developing mild anemia which could be coming from chronic kidney disease.  She continues to have unintentional weight loss.  She has lost additional 30 pounds in the past 5 months.  She does not have an appetite.  Denies any night sweats.  CT chest abdomen pelvis did not show any abnormal lymph node enlargement or concerns for malignancy.  Overall, I think it is less likely for stable small amount of IgM kappa protein to be contributing to her symptoms but with worsening anemia cannot rule out bone marrow pathology.  I offered her bone marrow biopsy procedure and discussed the rationale.  Patient is agreeable to proceed. If her bone marrow biopsy comes back negative, I do not think there is any additional workup from the hematology/oncology standpoint for her weight loss.  Thyroid function test was normal.  # Unintentional weight loss -Up to date with mammogram and screening colonoscopy.  -As above  # Left lung base nodule -CT chest from March 2025 showed stable 6 mm left lung base nodule likely from scarring or lymph node.  No further follow-up imaging recommended.  Orders Placed This Encounter  Procedures   IR BONE MARROW BIOPSY & ASPIRATION   CBC with Differential (Cancer Center Only)   Haptoglobin   Lactate dehydrogenase   Ferritin   Iron and TIBC([facility])   Bilirubin, fractionated(tot/dir/indir)   RTC in 4 weeks for MD visit, labs, discuss bone marrow biopsy.  Patient expressed understanding and was in agreement with this plan. She also understands that She can call clinic at any time with any questions, concerns, or complaints.   I spent a total of 30 minutes reviewing chart data, face-to-face evaluation with the patient, counseling and coordination of care as detailed above.  Michaelyn Barter, MD   11/04/2023 2:27  PM

## 2023-11-04 NOTE — Telephone Encounter (Signed)
 Contacted Paulla Fore in IR scheduling. Msg sent to schedule pt's port placement. I have checked with Trula Ore in Prior auth. The bone marrow does not require any insurance pre authorization.

## 2023-11-07 ENCOUNTER — Telehealth: Payer: Self-pay | Admitting: *Deleted

## 2023-11-07 NOTE — Telephone Encounter (Signed)
 Attempted to reach patient via home line- pt did not answer.  Attempted to reach patient via cell- vm box is not set up.  Bone marrow biopsy date available is this Friday 28th at 8:30a an arrive at 7:30a. Waiting on call back from patient.

## 2023-11-08 NOTE — Telephone Encounter (Signed)
 Contacted patient with apt time for bone marrow biopsy. Apt is scheduled for Tues 4/1 at 8:30a an arrive at 7:30 a. Pt should be nothing by mouth after midnight prior to procedure. Pt instructed to bring a driver for the procedure and report to the heart/vascular location of [facility] for procedure check in.

## 2023-11-14 ENCOUNTER — Other Ambulatory Visit: Payer: Self-pay | Admitting: Radiology

## 2023-11-14 DIAGNOSIS — Z01812 Encounter for preprocedural laboratory examination: Secondary | ICD-10-CM

## 2023-11-14 NOTE — Progress Notes (Signed)
 Patient for CT Bone Marrow Biopsy on Tues 11/15/23, I called and LVM for the patient on the phone and gave pre-procedure instructions. VM made patient aware to be here at 7:30a, NPO after MN prior to procedure as well as driver post procedure/recovery/discharge. Called 11/14/23

## 2023-11-15 ENCOUNTER — Other Ambulatory Visit: Payer: Self-pay

## 2023-11-15 ENCOUNTER — Telehealth: Payer: Self-pay

## 2023-11-15 ENCOUNTER — Ambulatory Visit
Admission: RE | Admit: 2023-11-15 | Discharge: 2023-11-15 | Disposition: A | Discharge status: Home / Self Care | Source: Ambulatory Visit | Attending: Internal Medicine | Admitting: Internal Medicine

## 2023-11-15 DIAGNOSIS — Z01812 Encounter for preprocedural laboratory examination: Secondary | ICD-10-CM

## 2023-11-15 DIAGNOSIS — R897 Abnormal histological findings in specimens from other organs, systems and tissues: Secondary | ICD-10-CM | POA: Diagnosis not present

## 2023-11-15 DIAGNOSIS — I129 Hypertensive chronic kidney disease with stage 1 through stage 4 chronic kidney disease, or unspecified chronic kidney disease: Secondary | ICD-10-CM | POA: Insufficient documentation

## 2023-11-15 DIAGNOSIS — R634 Abnormal weight loss: Secondary | ICD-10-CM | POA: Diagnosis present

## 2023-11-15 DIAGNOSIS — I2510 Atherosclerotic heart disease of native coronary artery without angina pectoris: Secondary | ICD-10-CM | POA: Insufficient documentation

## 2023-11-15 DIAGNOSIS — E1122 Type 2 diabetes mellitus with diabetic chronic kidney disease: Secondary | ICD-10-CM | POA: Diagnosis not present

## 2023-11-15 DIAGNOSIS — I25810 Atherosclerosis of coronary artery bypass graft(s) without angina pectoris: Secondary | ICD-10-CM | POA: Diagnosis not present

## 2023-11-15 DIAGNOSIS — N189 Chronic kidney disease, unspecified: Secondary | ICD-10-CM | POA: Diagnosis not present

## 2023-11-15 DIAGNOSIS — D472 Monoclonal gammopathy: Secondary | ICD-10-CM | POA: Insufficient documentation

## 2023-11-15 DIAGNOSIS — D649 Anemia, unspecified: Secondary | ICD-10-CM | POA: Insufficient documentation

## 2023-11-15 LAB — GLUCOSE, CAPILLARY: Glucose-Capillary: 107 mg/dL — ABNORMAL HIGH (ref 70–99)

## 2023-11-15 LAB — CBC WITH DIFFERENTIAL/PLATELET
Abs Immature Granulocytes: 0.02 10*3/uL (ref 0.00–0.07)
Basophils Absolute: 0.1 10*3/uL (ref 0.0–0.1)
Basophils Relative: 1 %
Eosinophils Absolute: 0.1 10*3/uL (ref 0.0–0.5)
Eosinophils Relative: 1 %
HCT: 32.1 % — ABNORMAL LOW (ref 36.0–46.0)
Hemoglobin: 10.9 g/dL — ABNORMAL LOW (ref 12.0–15.0)
Immature Granulocytes: 0 %
Lymphocytes Relative: 29 %
Lymphs Abs: 1.9 10*3/uL (ref 0.7–4.0)
MCH: 32.2 pg (ref 26.0–34.0)
MCHC: 34 g/dL (ref 30.0–36.0)
MCV: 95 fL (ref 80.0–100.0)
Monocytes Absolute: 0.7 10*3/uL (ref 0.1–1.0)
Monocytes Relative: 10 %
Neutro Abs: 4 10*3/uL (ref 1.7–7.7)
Neutrophils Relative %: 59 %
Platelets: UNDETERMINED 10*3/uL (ref 150–400)
RBC: 3.38 MIL/uL — ABNORMAL LOW (ref 3.87–5.11)
RDW: 13 % (ref 11.5–15.5)
Smear Review: UNDETERMINED
WBC: 6.8 10*3/uL (ref 4.0–10.5)
nRBC: 0 % (ref 0.0–0.2)

## 2023-11-15 MED ORDER — SODIUM CHLORIDE 0.9 % IV SOLN: INTRAVENOUS | Status: DC

## 2023-11-15 MED ORDER — FENTANYL CITRATE (PF) 100 MCG/2ML IJ SOLN
INTRAMUSCULAR | Status: AC
  Filled 2023-11-15: qty 2

## 2023-11-15 MED ORDER — LIDOCAINE 1 % OPTIME INJ - NO CHARGE
10.0000 mL | Freq: Once | INTRAMUSCULAR | Status: AC
  Administered 2023-11-15: 10 mL
  Filled 2023-11-15: qty 10

## 2023-11-15 MED ORDER — MIDAZOLAM HCL 2 MG/2ML IJ SOLN
INTRAMUSCULAR | Status: AC
Start: 2023-11-15 — End: ?
  Filled 2023-11-15: qty 2

## 2023-11-15 MED ORDER — FENTANYL CITRATE (PF) 100 MCG/2ML IJ SOLN
INTRAMUSCULAR | Status: AC | PRN
  Administered 2023-11-15: 50 ug via INTRAVENOUS
  Administered 2023-11-15: 25 ug via INTRAVENOUS

## 2023-11-15 MED ORDER — MIDAZOLAM HCL 2 MG/2ML IJ SOLN
INTRAMUSCULAR | Status: AC | PRN
  Administered 2023-11-15: .5 mg via INTRAVENOUS
  Administered 2023-11-15: 1 mg via INTRAVENOUS

## 2023-11-15 MED ORDER — HEPARIN SOD (PORK) LOCK FLUSH 100 UNIT/ML IV SOLN
INTRAVENOUS | Status: AC
  Filled 2023-11-15: qty 5

## 2023-11-15 NOTE — Procedures (Signed)
 Interventional Radiology Procedure Note  Procedure: CT guided bone marrow aspiration and biopsy  Complications: None  EBL: < 10 mL  Findings: Aspirate and core biopsy performed of bone marrow in right iliac bone.  Plan: Bedrest supine x 1 hrs  Ericha Whittingham T. Fredia Sorrow, M.D Pager:  [phone]

## 2023-11-15 NOTE — H&P (Addendum)
 Chief Complaint: Anemia; unexplained weight loss; request for image guided bone marrow biopsy  Referring Provider(s): Agrawal,Kavita   Supervising Physician: Irish Lack  Patient Status: [facility] - Out-pt  History of Present Illness: Pam Baker is a 78 y.o. female with past medical history of hypertension, diabetes, hyperlipidemia, CKD (baseline creat 1.3-1.4), CAD status post CABG was referred to hematology for abnormal SPEP. Dr. Charolotte Eke notes myeloma labs have been stable for 5 months and anemia may be related to CKD. As part of heme/onc workup, a bone marrow biopsy has been requested from IR to further investigate for lymphoproliferative disease.   Endorses unintentional weight loss (23 lb in 1 yr). Otherwise, well feeling. See full ROS below.   NPO since MN, allergies reviewed, takes ASA (low bleeding risk procedure, ok that she has not held). Accompanied by daughter this morning.  Patient is Full Code  Past Medical History:  Diagnosis Date   Colon polyps    personal history, adenomatous   Coronary atherosclerosis of autologous vein bypass graft    Coronary atherosclerosis of native coronary artery    Diabetes mellitus type 2, uncomplicated (HCC)    diet-controlled   Hyperlipidemia    Hypertension    Postmenopausal    Pure hypercholesterolemia     Past Surgical History:  Procedure Laterality Date   ABDOMINAL HYSTERECTOMY  05/2001   TAH-BSO secondary to postmenopausal bleeding and uterine fibroids   APPENDECTOMY     BREAST BIOPSY Bilateral    1980's, benign per pt   BREAST EXCISIONAL BIOPSY Bilateral 92 and 82   CHOLECYSTECTOMY     COLONOSCOPY WITH PROPOFOL N/A 11/07/2015   Procedure: COLONOSCOPY WITH PROPOFOL;  Surgeon: Christena Deem, MD;  Location: [facility] ENDOSCOPY;  Service: Endoscopy;  Laterality: N/A;   COLONOSCOPY WITH PROPOFOL N/A 03/16/2021   Procedure: COLONOSCOPY WITH PROPOFOL;  Surgeon: Regis Bill, MD;  Location: [facility] ENDOSCOPY;   Service: Endoscopy;  Laterality: N/A;   CORONARY ARTERY BYPASS GRAFT  12/2007   single vessel CABG surgery    Allergies: Amoxicillin and Lisinopril  Medications: Prior to Admission medications   Medication Sig Start Date End Date Taking? Authorizing Provider  aspirin 81 MG tablet Take 81 mg by mouth daily.    [provider]  atorvastatin (LIPITOR) 20 MG tablet Take 20 mg by mouth daily.    [provider]  Calcium Carbonate-Vit D-Min (CALCIUM 600 + MINERALS) 600-200 MG-UNIT TABS Take by mouth.    [provider]  losartan-hydrochlorothiazide (HYZAAR) 50-12.5 MG tablet Take 1 tablet by mouth daily.    [provider]  metoprolol succinate (TOPROL-XL) 100 MG 24 hr tablet Take 100 mg by mouth daily. Take with or immediately following a meal.    [provider]     Family History  Problem Relation Age of Onset   Breast cancer Neg Hx     Social History   Socioeconomic History   Marital status: Married    Spouse name: Redell   Number of children: 3   Years of education: Not on file   Highest education level: Not on file  Occupational History   Not on file  Tobacco Use   Smoking status: Never   Smokeless tobacco: Never  Substance and Sexual Activity   Alcohol use: No   Drug use: No   Sexual activity: Not on file  Other Topics Concern   Not on file  Social History Narrative   Lives at home with spouse; 3 children.  Social Drivers of Corporate investment banker Strain: Low Risk  (05/02/2023)   Received from [facility]   Overall Financial Resource Strain (CARDIA)    Difficulty of Paying Living Expenses: Not hard at all  Food Insecurity: No Food Insecurity (05/09/2023)   Hunger Vital Sign    Worried About Running Out of Food in the Last Year: Never true    Ran Out of Food in the Last Year: Never true  Transportation Needs: No Transportation Needs (05/09/2023)   PRAPARE - Scientist, research (physical sciences) (Medical): No    Lack of Transportation (Non-Medical): No  Physical Activity: Not on file  Stress: Not on file  Social Connections: Not on file     Review of Systems: A 12 point ROS discussed and pertinent positives are indicated in the HPI above.  All other systems are negative.  Review of Systems  Constitutional:  Positive for unexpected weight change. Negative for chills and fever.  Respiratory:  Negative for chest tightness and shortness of breath.   Cardiovascular:  Negative for chest pain and palpitations.  Gastrointestinal:  Negative for abdominal pain, nausea and vomiting.  Genitourinary:  Negative for dysuria and hematuria.  Hematological:  Does not bruise/bleed easily.     Vital Signs: BP (!) 126/59   Pulse 82   Temp 97.8 F (36.6 C) (Oral)   Resp 15   Ht 5\' 1"  (1.549 m)   Wt 138 lb 14.2 oz (63 kg)   SpO2 100%   BMI 26.24 kg/m   Advance Care Plan: No documents on file   Physical Exam Constitutional:      Appearance: Normal appearance.  HENT:     Mouth/Throat:     Mouth: Mucous membranes are dry.     Pharynx: Oropharynx is clear.  Cardiovascular:     Rate and Rhythm: Normal rate. Rhythm irregular.     Pulses: Normal pulses.  Pulmonary:     Effort: Pulmonary effort is normal.     Breath sounds: Normal breath sounds.  Abdominal:     General: Abdomen is flat.     Palpations: Abdomen is soft.     Tenderness: There is no abdominal tenderness.  Skin:    General: Skin is warm and dry.  Neurological:     Mental Status: She is alert and oriented to person, place, and time.  Psychiatric:        Mood and Affect: Mood normal.        Behavior: Behavior normal.      Imaging: CT Chest Wo Contrast Result Date: 10/31/2023 CLINICAL DATA:  Lung nodule. EXAM: CT CHEST WITHOUT CONTRAST TECHNIQUE: Multidetector CT imaging of the chest was performed following the standard protocol without IV contrast. RADIATION DOSE REDUCTION: This exam was performed  according to the departmental dose-optimization program which includes automated exposure control, adjustment of the mA and/or kV according to patient size and/or use of iterative reconstruction technique. COMPARISON:  CT abdomen pelvis 05/18/2023. FINDINGS: Cardiovascular: Atherosclerotic calcification of the aorta. Enlarged pulmonic trunk and heart. No pericardial effusion. Mediastinum/Nodes: No pathologically enlarged mediastinal or axillary lymph nodes. Hilar regions are difficult to definitively evaluate without IV contrast. Air in the esophagus can be seen with dysmotility. Lungs/Pleura: Scattered pulmonary parenchymal scarring. Previously described 6 mm nodule in the lateral left lung base is due to scarring or a benign juxtapleural lymph node. No suspicious pulmonary nodules. No pleural fluid. Airway is unremarkable. Upper Abdomen: Cholecystectomy. Visualized portions of the liver,  adrenal glands, kidneys, spleen, pancreas, stomach and bowel are grossly unremarkable. No upper abdominal adenopathy. Musculoskeletal: Degenerative changes in the spine. IMPRESSION: 1. No suspicious pulmonary nodules requiring follow-up. 2.  Aortic atherosclerosis (ICD10-I70.0). 3. Enlarged pulmonic trunk, indicative of pulmonary arterial hypertension. Electronically Signed   By: Leanna Battles M.D.   On: 10/31/2023 12:27    Labs:  CBC: Recent Labs    05/09/23 1217 10/19/23 1246  WBC 4.7 4.5  HGB 11.3* 10.2*  HCT 34.7* 30.4*  PLT 226 200    COAGS: No results for input(s): "INR", "APTT" in the last 8760 hours.  BMP: Recent Labs    05/09/23 1217 10/19/23 1247  NA 139 134*  K 3.0* 3.4*  CL 100 99  CO2 27 27  GLUCOSE 111* 98  BUN 21 15  CALCIUM 9.4 9.3  CREATININE 1.49* 1.38*  GFRNONAA 36* 39*    LIVER FUNCTION TESTS: Recent Labs    05/09/23 1217 10/19/23 1247  BILITOT 1.3* 1.5*  AST 27 25  ALT 13 14  ALKPHOS 88 79  PROT 7.5 7.1  ALBUMIN 4.0 4.1    TUMOR MARKERS: No results for  input(s): "AFPTM", "CEA", "CA199", "CHROMGRNA" in the last 8760 hours.  Assessment and Plan:  Request for image guided bone marrow biopsy - Abnormal SPEP, unintentional weight loss, anemia support concern for lymphoproliferative disease - afebrile, VSS - 10/19/23 Hgb within acceptable range, repeat labs not indicated for low bleeding risk procedure - case approved, scheduled 4/1 with Dr. Fredia Sorrow  Risks and benefits of image guided bone marrow biopsy was discussed with the patient and patient's family including, but not limited to bleeding, infection, damage to adjacent structures or low yield requiring additional tests.  All of the questions were answered and there is agreement to proceed.  Consent signed and in chart.   Thank you for allowing our service to participate in BRAYDEE SHIMKUS 's care.    Electronically Signed: Carlton Adam, NP   11/15/2023, 8:42 AM     I spent a total of  30 Minutes   in face to face in clinical consultation, greater than 50% of which was counseling/coordinating care for image guided bone marrow biopsy.   (A copy of this note was sent to the referring provider and the time of visit.)

## 2023-11-17 LAB — SURGICAL PATHOLOGY

## 2023-11-25 ENCOUNTER — Encounter (HOSPITAL_COMMUNITY): Payer: Self-pay | Admitting: Internal Medicine

## 2023-11-25 ENCOUNTER — Other Ambulatory Visit: Payer: BC Managed Care – PPO

## 2023-12-05 ENCOUNTER — Other Ambulatory Visit: Payer: Self-pay

## 2023-12-05 ENCOUNTER — Ambulatory Visit: Payer: BC Managed Care – PPO | Admitting: Internal Medicine

## 2023-12-05 ENCOUNTER — Encounter: Payer: Self-pay | Admitting: Internal Medicine

## 2023-12-05 ENCOUNTER — Inpatient Hospital Stay (HOSPITAL_BASED_OUTPATIENT_CLINIC_OR_DEPARTMENT_OTHER): Admitting: Internal Medicine

## 2023-12-05 ENCOUNTER — Inpatient Hospital Stay: Attending: Internal Medicine

## 2023-12-05 VITALS — BP 130/66 | HR 90 | Temp 97.9°F | Resp 18 | Ht 61.0 in | Wt 139.4 lb

## 2023-12-05 DIAGNOSIS — R634 Abnormal weight loss: Secondary | ICD-10-CM

## 2023-12-05 DIAGNOSIS — N183 Chronic kidney disease, stage 3 unspecified: Secondary | ICD-10-CM | POA: Insufficient documentation

## 2023-12-05 DIAGNOSIS — D472 Monoclonal gammopathy: Secondary | ICD-10-CM | POA: Insufficient documentation

## 2023-12-05 DIAGNOSIS — R911 Solitary pulmonary nodule: Secondary | ICD-10-CM | POA: Insufficient documentation

## 2023-12-05 DIAGNOSIS — E1122 Type 2 diabetes mellitus with diabetic chronic kidney disease: Secondary | ICD-10-CM | POA: Insufficient documentation

## 2023-12-05 DIAGNOSIS — D649 Anemia, unspecified: Secondary | ICD-10-CM | POA: Diagnosis not present

## 2023-12-05 DIAGNOSIS — I129 Hypertensive chronic kidney disease with stage 1 through stage 4 chronic kidney disease, or unspecified chronic kidney disease: Secondary | ICD-10-CM | POA: Diagnosis not present

## 2023-12-05 DIAGNOSIS — Z7982 Long term (current) use of aspirin: Secondary | ICD-10-CM | POA: Insufficient documentation

## 2023-12-05 DIAGNOSIS — Z79899 Other long term (current) drug therapy: Secondary | ICD-10-CM | POA: Diagnosis not present

## 2023-12-05 LAB — CBC WITH DIFFERENTIAL (CANCER CENTER ONLY)
Abs Immature Granulocytes: 0.03 10*3/uL (ref 0.00–0.07)
Basophils Absolute: 0 10*3/uL (ref 0.0–0.1)
Basophils Relative: 0 %
Eosinophils Absolute: 0 10*3/uL (ref 0.0–0.5)
Eosinophils Relative: 1 %
HCT: 30.9 % — ABNORMAL LOW (ref 36.0–46.0)
Hemoglobin: 10.5 g/dL — ABNORMAL LOW (ref 12.0–15.0)
Immature Granulocytes: 1 %
Lymphocytes Relative: 22 %
Lymphs Abs: 1.4 10*3/uL (ref 0.7–4.0)
MCH: 31.5 pg (ref 26.0–34.0)
MCHC: 34 g/dL (ref 30.0–36.0)
MCV: 92.8 fL (ref 80.0–100.0)
Monocytes Absolute: 0.5 10*3/uL (ref 0.1–1.0)
Monocytes Relative: 8 %
Neutro Abs: 4.4 10*3/uL (ref 1.7–7.7)
Neutrophils Relative %: 68 %
Platelet Count: 209 10*3/uL (ref 150–400)
RBC: 3.33 MIL/uL — ABNORMAL LOW (ref 3.87–5.11)
RDW: 12.6 % (ref 11.5–15.5)
WBC Count: 6.4 10*3/uL (ref 4.0–10.5)
nRBC: 0 % (ref 0.0–0.2)

## 2023-12-05 LAB — BILIRUBIN, FRACTIONATED(TOT/DIR/INDIR)
Bilirubin, Direct: 0.2 mg/dL (ref 0.0–0.2)
Indirect Bilirubin: 1.3 mg/dL — ABNORMAL HIGH (ref 0.3–0.9)
Total Bilirubin: 1.5 mg/dL — ABNORMAL HIGH (ref 0.0–1.2)

## 2023-12-05 LAB — IRON AND TIBC
Iron: 45 ug/dL (ref 28–170)
Saturation Ratios: 18 % (ref 10.4–31.8)
TIBC: 258 ug/dL (ref 250–450)
UIBC: 213 ug/dL

## 2023-12-05 LAB — FERRITIN: Ferritin: 788 ng/mL — ABNORMAL HIGH (ref 11–307)

## 2023-12-05 LAB — LACTATE DEHYDROGENASE: LDH: 216 U/L — ABNORMAL HIGH (ref 98–192)

## 2023-12-05 NOTE — Progress Notes (Signed)
 [name]  [phone] [phone]  ID: [name] OB: 12-Feb-1946  MR#: [id number]  WUX#:[id number]  Patient Care Team: Delmus Ferri, MD as PCP - General (Physician Assistant) Loreatha Rodney, MD as Consulting Physician (Oncology)  REASON FOR REFERRAL: Abnormal SPEP, unintentional weight loss  HPI: Pam Baker is a 78 y.o. female with past medical history of hypertension, diabetes, hyperlipidemia, CAD status post CABG was referred to hematology for abnormal SPEP.  Reports history of COVID infection in September 2023.  Unintentional weight loss of about 23 pounds in past 1 year.  She denies any shortness of breath, chest pain, bleeding in urine or stools.  Denies any bone pain or neuropathy.  For weight loss, SPEP/IFE testing done by primary.  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  CBC showed hemoglobin 11.3, WBC 4.7 and platelet 226.  Creatinine 1.49.  Has history of CKD stage III since 2021.  Creatinine ranging between 1.3-1.4.  CT abdomen pelvis from 05/2023 showed 6 mm left lung base nodule.  No concerning abdominal findings. CT chest from 10/2023 showed 6 mm left lung nodule which is likely due to scarring or a lymph node.  No concerns for malignancy Up to date with mammogram Colonoscopy from August 2022 with Dr. Emerick Hanlon showed multiple polyps.  Pathology tubular adenoma.  Future colonoscopy was not recommended due to age.  11/15/2023 -patient further lost 30 pounds in the last 5 months.  On labs, mild worsening of anemia.  With unclear etiology for her weight loss and new onset anemia with negative workup, discussed about bone marrow biopsy although less likely to yield more information.  Patient decided to proceed to complete all the workup.  Flow cytometry on bmbx showed slight kappa excess in the lymphocyte population positive for CD19, 20 and CD22.  From 11/15/2023 showed normocellular bone marrow  with mild increase in the plasma cells to 6%.  No definitive morphologic or IHC evidence of involvement by lymphoproliferative disorder.   Cytogenetics showed normal46, XX karyotype.  Interval history Patient was seen today as follow-up to further discuss the bone marrow biopsy results.  REVIEW OF SYSTEMS:   ROS  As per HPI. Otherwise, a complete review of systems is negative.  PAST MEDICAL HISTORY: Past Medical History:  Diagnosis Date   Colon polyps    personal history, adenomatous   Coronary atherosclerosis of autologous vein bypass graft    Coronary atherosclerosis of native coronary artery    Diabetes mellitus type 2, uncomplicated (HCC)    diet-controlled   Hyperlipidemia    Hypertension    Postmenopausal    Pure hypercholesterolemia     PAST SURGICAL HISTORY: Past Surgical History:  Procedure Laterality Date   ABDOMINAL HYSTERECTOMY  05/2001   TAH-BSO secondary to postmenopausal bleeding and uterine fibroids   APPENDECTOMY     BREAST BIOPSY Bilateral    1980's, benign per pt   BREAST EXCISIONAL BIOPSY Bilateral 92 and 82   CHOLECYSTECTOMY     COLONOSCOPY WITH PROPOFOL  N/A 11/07/2015   Procedure: COLONOSCOPY WITH PROPOFOL ;  Surgeon: Deveron Fly, MD;  Location: [facility] ENDOSCOPY;  Service: Endoscopy;  Laterality: N/A;   COLONOSCOPY WITH PROPOFOL  N/A 03/16/2021   Procedure: COLONOSCOPY WITH PROPOFOL ;  Surgeon: Shane Darling, MD;  Location: [facility] ENDOSCOPY;  Service: Endoscopy;  Laterality: N/A;   CORONARY ARTERY BYPASS GRAFT  12/2007   single vessel CABG surgery    FAMILY HISTORY: Family History  Problem Relation Age of Onset  Breast cancer Neg Hx     HEALTH MAINTENANCE: Social History   Tobacco Use   Smoking status: Never   Smokeless tobacco: Never  Substance Use Topics   Alcohol use: No   Drug use: No     Allergies  Allergen Reactions   Amoxicillin    Lisinopril     Current Outpatient Medications  Medication Sig Dispense Refill    aspirin 81 MG tablet Take 81 mg by mouth daily.     atorvastatin (LIPITOR) 20 MG tablet Take 20 mg by mouth daily.     Calcium Carbonate-Vit D-Min (CALCIUM 600 + MINERALS) 600-200 MG-UNIT TABS Take by mouth.     losartan-hydrochlorothiazide (HYZAAR) 50-12.5 MG tablet Take 1 tablet by mouth daily.     metoprolol succinate (TOPROL-XL) 100 MG 24 hr tablet Take 100 mg by mouth daily. Take with or immediately following a meal.     No current facility-administered medications for this visit.    OBJECTIVE: Vitals:   12/05/23 1317  BP: 130/66  Resp: 18  Temp: 97.9 F (36.6 C)      Body mass index is 26.34 kg/m.      General: Well-developed, well-nourished, no acute distress. Eyes: Pink conjunctiva, anicteric sclera. HEENT: Normocephalic, moist mucous membranes, clear oropharnyx. Lungs: Clear to auscultation bilaterally. Heart: Regular rate and rhythm. No rubs, murmurs, or gallops. Abdomen: Soft, nontender, nondistended. No organomegaly noted, normoactive bowel sounds. Musculoskeletal: No edema, cyanosis, or clubbing. Neuro: Alert, answering all questions appropriately. Cranial nerves grossly intact. Skin: No rashes or petechiae noted. Psych: Normal affect. Lymphatics: No cervical, calvicular, axillary or inguinal LAD.   LAB RESULTS:  Lab Results  Component Value Date   NA 134 (L) 10/19/2023   K 3.4 (L) 10/19/2023   CL 99 10/19/2023   CO2 27 10/19/2023   GLUCOSE 98 10/19/2023   BUN 15 10/19/2023   CREATININE 1.38 (H) 10/19/2023   CALCIUM 9.3 10/19/2023   PROT 7.1 10/19/2023   ALBUMIN 4.1 10/19/2023   AST 25 10/19/2023   ALT 14 10/19/2023   ALKPHOS 79 10/19/2023   BILITOT 1.5 (H) 10/19/2023   GFRNONAA 39 (L) 10/19/2023    Lab Results  Component Value Date   WBC 6.4 12/05/2023   NEUTROABS 4.4 12/05/2023   HGB 10.5 (L) 12/05/2023   HCT 30.9 (L) 12/05/2023   MCV 92.8 12/05/2023   PLT 209 12/05/2023    Lab Results  Component Value Date   TIBC 287 10/19/2023    FERRITIN 247 10/19/2023   IRONPCTSAT 24 10/19/2023     STUDIES: CT BONE MARROW BIOPSY & ASPIRATION Result Date: 11/15/2023 CLINICAL DATA:  IgM kappa monoclonal gammopathy and need for bone marrow biopsy. EXAM: CT GUIDED BONE MARROW ASPIRATION AND BIOPSY ANESTHESIA/SEDATION: Moderate (conscious) sedation was employed during this procedure. A total of Versed  1.5 mg and Fentanyl  a 5 mcg was administered intravenously. Moderate Sedation Time: 20 minutes. The patient's level of consciousness and vital signs were monitored continuously by radiology nursing throughout the procedure under my direct supervision. PROCEDURE: The procedure risks, benefits, and alternatives were explained to the patient. Questions regarding the procedure were encouraged and answered. The patient understands and consents to the procedure. A time out was performed prior to initiating the procedure. The right gluteal region was prepped with chlorhexidine. Sterile gown and sterile gloves were used for the procedure. Local anesthesia was provided with 1% Lidocaine . Under CT guidance, an 11 gauge On Control bone cutting needle was advanced from a posterior approach into the right  iliac bone. Needle positioning was confirmed with CT. Initial non heparinized and heparinized aspirate samples were obtained of bone marrow. Core biopsy was performed via the On Control drill needle. COMPLICATIONS: None FINDINGS: Inspection of initial aspirate did reveal visible particles. Intact core biopsy sample was obtained. IMPRESSION: CT guided bone marrow biopsy of right posterior iliac bone with both aspirate and core samples obtained. Electronically Signed   By: Erica Hau M.D.   On: 11/15/2023 09:49    ASSESSMENT AND PLAN:   Nashika Coker Sandall is a 78 y.o. female with pmh of hypertension, diabetes, hyperlipidemia, CAD status post CABG was referred to hematology for abnormal SPEP and unintentional weight loss.  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  Showed 0.6 g/dL IgM kappa paraprotein.  -Myeloma labs repeated at 6 months overall stable.  With patient's persistent unintentional weight loss/ poor appetite and new onset anemia hemoglobin of 10.2, other negative workup (iron panel, B12, folate) discussed about bone marrow biopsy. Flow cytometry showed slight kappa excess in the lymphocyte population positive for CD19, 20 and CD22. BMBx showed normocellular bone marrow with mild increase in the plasma cells to 6%.  No definitive morphologic or IHC evidence of involvement by lymphoproliferative disorder. Cytogenetics showed normal 46, XX karyotype.  -Will continue monitoring of IgM MGUS with repeat labs in 1 year.  # Unintentional weight loss -CT imaging as above has been unremarkable.  She is up-to-date with mammogram and screening colonoscopy.  From heme-onc standpoint, she has completed all of the workup and there has been no obvious etiology identified for her unintentional weight loss.  Patient to continue to follow with PCP to look into other etiologies.  # Normocytic anemia -Likely secondary to CKD stage III.  Iron panel/vitamin B12/folate normal. -Bone marrow biopsy as above.  With mild hyperbilirubinemia, will obtain hemolytic labs.  # CKD stage III - Present since 2020.  Is managed by PCP. - Has history of diabetes/hypertension/hyperlipidemia  # Left lung base nodule -CT chest from March 2025 showed stable 6 mm left lung base nodule likely from scarring or lymph node.  No further follow-up imaging recommended.  No orders of the defined types were placed in this encounter.  RTC in 1 year for MD visit, labs 2 weeks prior.  Patient expressed understanding and was in agreement with this plan. She also understands that She can call clinic at any time with any questions, concerns, or complaints.   I spent a total of 30 minutes reviewing chart data, face-to-face evaluation  with the patient, counseling and coordination of care as detailed above.  Aariana Shankland, MD   12/05/2023 1:43 PM

## 2023-12-06 LAB — HAPTOGLOBIN: Haptoglobin: 265 mg/dL (ref 42–346)

## 2024-01-11 ENCOUNTER — Other Ambulatory Visit: Payer: Self-pay | Admitting: Physician Assistant

## 2024-01-11 DIAGNOSIS — Z1231 Encounter for screening mammogram for malignant neoplasm of breast: Secondary | ICD-10-CM

## 2024-01-30 NOTE — Telephone Encounter (Signed)
 Error

## 2024-02-13 ENCOUNTER — Ambulatory Visit
Admission: RE | Admit: 2024-02-13 | Discharge: 2024-02-13 | Disposition: A | Discharge status: Home / Self Care | Source: Ambulatory Visit | Attending: Physician Assistant | Admitting: Physician Assistant

## 2024-02-13 DIAGNOSIS — Z1231 Encounter for screening mammogram for malignant neoplasm of breast: Secondary | ICD-10-CM | POA: Diagnosis present

## 2024-04-19 ENCOUNTER — Telehealth: Payer: Self-pay

## 2024-04-19 NOTE — Telephone Encounter (Signed)
 Voicemail received 04/19/24 at 9:27am from daughter Suzen Fuller Henle calling to schedule an appointment for patient. Best contact number [phone].  Forwarded to [place] to coordinate.

## 2024-04-19 NOTE — Telephone Encounter (Signed)
 Ok to move up appts to late October

## 2024-04-20 ENCOUNTER — Telehealth: Payer: Self-pay | Admitting: Oncology

## 2024-04-20 NOTE — Telephone Encounter (Signed)
 Dr. Babara reviewed labs and ok'd for pt's appt to be moved up to October (originally April 2026)

## 2024-04-20 NOTE — Telephone Encounter (Signed)
 Patients daughter Suzen called to schedule an appointment for her mother. She states her PCP Marikay called and said she needs a visit with Dr. Babara. Please advise scheduling. [phone] is Kimberlys phone number

## 2024-05-17 ENCOUNTER — Inpatient Hospital Stay: Attending: Physician Assistant

## 2024-05-24 ENCOUNTER — Inpatient Hospital Stay: Admitting: Oncology

## 2024-05-28 ENCOUNTER — Other Ambulatory Visit

## 2024-06-04 ENCOUNTER — Ambulatory Visit: Admitting: Oncology

## 2024-07-05 ENCOUNTER — Other Ambulatory Visit: Payer: Self-pay

## 2024-07-05 DIAGNOSIS — D472 Monoclonal gammopathy: Secondary | ICD-10-CM

## 2024-07-06 ENCOUNTER — Inpatient Hospital Stay: Attending: Physician Assistant

## 2024-07-06 DIAGNOSIS — D472 Monoclonal gammopathy: Secondary | ICD-10-CM

## 2024-07-06 LAB — CBC WITH DIFFERENTIAL/PLATELET
Abs Immature Granulocytes: 0.01 K/uL (ref 0.00–0.07)
Basophils Absolute: 0 K/uL (ref 0.0–0.1)
Basophils Relative: 1 %
Eosinophils Absolute: 0.1 K/uL (ref 0.0–0.5)
Eosinophils Relative: 1 %
HCT: 29.5 % — ABNORMAL LOW (ref 36.0–46.0)
Hemoglobin: 9.8 g/dL — ABNORMAL LOW (ref 12.0–15.0)
Immature Granulocytes: 0 %
Lymphocytes Relative: 34 %
Lymphs Abs: 1.4 K/uL (ref 0.7–4.0)
MCH: 31.2 pg (ref 26.0–34.0)
MCHC: 33.2 g/dL (ref 30.0–36.0)
MCV: 93.9 fL (ref 80.0–100.0)
Monocytes Absolute: 0.5 K/uL (ref 0.1–1.0)
Monocytes Relative: 12 %
Neutro Abs: 2.1 K/uL (ref 1.7–7.7)
Neutrophils Relative %: 52 %
Platelets: 169 K/uL (ref 150–400)
RBC: 3.14 MIL/uL — ABNORMAL LOW (ref 3.87–5.11)
RDW: 15.9 % — ABNORMAL HIGH (ref 11.5–15.5)
WBC: 4.1 K/uL (ref 4.0–10.5)
nRBC: 0 % (ref 0.0–0.2)

## 2024-07-06 LAB — COMPREHENSIVE METABOLIC PANEL WITH GFR
ALT: 13 U/L (ref 0–44)
AST: 29 U/L (ref 15–41)
Albumin: 4 g/dL (ref 3.5–5.0)
Alkaline Phosphatase: 62 U/L (ref 38–126)
Anion gap: 13 (ref 5–15)
BUN: 34 mg/dL — ABNORMAL HIGH (ref 8–23)
CO2: 25 mmol/L (ref 22–32)
Calcium: 9.1 mg/dL (ref 8.9–10.3)
Chloride: 99 mmol/L (ref 98–111)
Creatinine, Ser: 1.72 mg/dL — ABNORMAL HIGH (ref 0.44–1.00)
GFR, Estimated: 30 mL/min — ABNORMAL LOW (ref >60)
Glucose, Bld: 99 mg/dL (ref 70–99)
Potassium: 3.6 mmol/L (ref 3.5–5.1)
Sodium: 137 mmol/L (ref 135–145)
Total Bilirubin: 1.1 mg/dL (ref 0.0–1.2)
Total Protein: 7 g/dL (ref 6.5–8.1)

## 2024-07-09 LAB — MULTIPLE MYELOMA PANEL, SERUM
Albumin SerPl Elph-Mcnc: 3.3 g/dL (ref 2.9–4.4)
Albumin/Glob SerPl: 1.1 (ref 0.7–1.7)
Alpha 1: 0.3 g/dL (ref 0.0–0.4)
Alpha2 Glob SerPl Elph-Mcnc: 0.5 g/dL (ref 0.4–1.0)
B-Globulin SerPl Elph-Mcnc: 1 g/dL (ref 0.7–1.3)
Gamma Glob SerPl Elph-Mcnc: 1.4 g/dL (ref 0.4–1.8)
Globulin, Total: 3.2 g/dL (ref 2.2–3.9)
IgA: 302 mg/dL (ref 64–422)
IgG (Immunoglobin G), Serum: 1176 mg/dL (ref 586–1602)
IgM (Immunoglobulin M), Srm: 379 mg/dL — ABNORMAL HIGH (ref 26–217)
Total Protein ELP: 6.5 g/dL (ref 6.0–8.5)

## 2024-07-09 LAB — KAPPA/LAMBDA LIGHT CHAINS
Kappa free light chain: 63.5 mg/L — ABNORMAL HIGH (ref 3.3–19.4)
Kappa, lambda light chain ratio: 2.33 — ABNORMAL HIGH (ref 0.26–1.65)
Lambda free light chains: 27.3 mg/L — ABNORMAL HIGH (ref 5.7–26.3)

## 2024-07-11 ENCOUNTER — Inpatient Hospital Stay (HOSPITAL_BASED_OUTPATIENT_CLINIC_OR_DEPARTMENT_OTHER): Admitting: Oncology

## 2024-07-11 ENCOUNTER — Encounter: Payer: Self-pay | Admitting: Oncology

## 2024-07-11 VITALS — BP 132/58 | HR 69 | Temp 97.3°F | Resp 18 | Wt 105.5 lb

## 2024-07-11 DIAGNOSIS — N1832 Chronic kidney disease, stage 3b: Secondary | ICD-10-CM | POA: Diagnosis not present

## 2024-07-11 DIAGNOSIS — D649 Anemia, unspecified: Secondary | ICD-10-CM | POA: Diagnosis not present

## 2024-07-11 DIAGNOSIS — R634 Abnormal weight loss: Secondary | ICD-10-CM | POA: Diagnosis not present

## 2024-07-11 DIAGNOSIS — D472 Monoclonal gammopathy: Secondary | ICD-10-CM | POA: Diagnosis not present

## 2024-07-11 DIAGNOSIS — D631 Anemia in chronic kidney disease: Secondary | ICD-10-CM

## 2024-07-11 DIAGNOSIS — R911 Solitary pulmonary nodule: Secondary | ICD-10-CM

## 2024-07-11 NOTE — Assessment & Plan Note (Signed)
 Hb is slightly below 10.  Patient prefers not to recheck blood work today  Will check iron tibc ferritin at next visit.

## 2024-07-11 NOTE — Assessment & Plan Note (Signed)
-   Previous CT chest abdomen pelvis findings were unremarkable.  She is up-to-date with mammogram and screening colonoscopy.  From heme-onc standpoint, she has completed all of the workup and there has been no obvious etiology identified for her unintentional weight loss.  Likely failure to thrive.  Patient to continue to follow with PCP to look into other etiologies. Recommend nutritionist evaluation.  She declined.  Recommend patient to take nutrition supplementation.

## 2024-07-11 NOTE — Progress Notes (Signed)
 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  M protein 0.6.   Previous bone marrow biopsy showed n ormocellular bone marrow with mild increase in the plasma cells to 6%.  No definitive morphologic or IHC evidence of involvement by lymphoproliferative disorder. Cytogenetics showed normal 46, XX karyotype.  Lab Results  Component Value Date   MPROTEIN Not Observed 07/06/2024   KPAFRELGTCHN 63.5 (H) 07/06/2024   LAMBDASER 27.3 (H) 07/06/2024   KAPLAMBRATIO 2.33 (H) 07/06/2024    Continue observation.     Unintentional weight loss - Previous CT chest abdomen pelvis findings were unremarkable.  She is up-to-date with mammogram and screening colonoscopy.  From heme-onc standpoint, she has completed all of the workup and there has been no obvious etiology identified for her unintentional weight loss.  Likely failure to thrive.  Patient to continue to follow with PCP to look into other etiologies. Recommend nutritionist evaluation.  She declined.  Recommend patient to take nutrition supplementation.  Anemia in chronic kidney disease (CKD) Hb is slightly below 10.  Patient prefers not to recheck blood work today  Will check iron tibc ferritin at next visit.    Lung nodule -CT chest from March 2025 showed stable 6 mm left lung base nodule likely from scarring or lymph node.  No further follow-up imaging recommended.  Orders Placed This Encounter  Procedures   CBC with Differential (Cancer Center Only)    Standing Status:   Future    Expected Date:   01/08/2025    Expiration Date:   04/08/2025    CMP (Cancer Center only)    Standing Status:   Future    Expected Date:   01/08/2025    Expiration Date:   04/08/2025   Ferritin    Standing Status:   Future    Expected Date:   01/08/2025    Expiration Date:   04/08/2025   Iron and TIBC    Standing Status:   Future    Expected Date:   01/08/2025    Expiration Date:   04/08/2025   Retic Panel    Standing Status:   Future    Expected Date:   01/08/2025    Expiration Date:   04/08/2025   Multiple Myeloma Panel (SPEP&IFE w/QIG)    Standing Status:   Future    Expected Date:   01/08/2025    Expiration Date:   04/08/2025   Kappa/lambda light chains    Standing Status:   Future    Expected Date:   01/08/2025    Expiration Date:   04/08/2025   TSH    Standing Status:   Future    Expected Date:   01/08/2025    Expiration Date:   04/08/2025   Follow up in 6 months.  All questions were answered. The patient knows to call the clinic with any problems, questions or concerns.  Call Babara, MD, PhD [facility] Hematology Oncology 07/11/2024   HPI: Pam Baker is a 78 y.o. female with past medical history of hypertension, diabetes, hyperlipidemia, CAD status post CABG, MGUS, weight loss presents for follow up   Reports history of COVID infection in September 2023.  Unintentional weight loss of about 23 pounds in past 1 year.  She denies any shortness of breath, chest pain, bleeding in urine or stools.  Denies any bone pain or neuropathy.  For weight loss, SPEP/IFE testing done by primary.  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  CBC showed hemoglobin 11.3, WBC 4.7 and platelet 226.  Creatinine 1.49.  Has history of CKD stage III since 2021.  Creatinine ranging between 1.3-1.4.  CT abdomen pelvis from 05/2023 showed 6 mm left lung base nodule.  No concerning abdominal findings. CT chest from 10/2023 showed 6 mm left lung nodule which is likely due to scarring or a lymph node.  No concerns for malignancy Up to date with  mammogram Colonoscopy from August 2022 with Dr. Maryruth showed multiple polyps.  Pathology tubular adenoma.  Future colonoscopy was not recommended due to age.  11/15/2023 -patient further lost 30 pounds in the last 5 months.  On labs, mild worsening of anemia.  With unclear etiology for her weight loss and new onset anemia with negative workup, discussed about bone marrow biopsy although less likely to yield more information.  Patient decided to proceed to complete all the workup.  Flow cytometry on bmbx showed slight kappa excess in the lymphocyte population positive for CD19, 20 and CD22.  From 11/15/2023 showed normocellular bone marrow with mild increase in the plasma cells to 6%.  No definitive morphologic or IHC evidence of involvement by lymphoproliferative disorder.   Cytogenetics showed normal46, XX karyotype.  Interval history Patient presents for follow up of MGUS, weight loss.  Patient previously followed up with Dr.Agrawal. Patient switched care to me on 07/11/24  Extensive medical record review was performed by me She reports feeling well.  Accompanied by daughter. Patient has lost 30 pounds.  Daughter reports that she eats very little. No fever, night sweats.  Patient reports that recently her appetite has slightly improved.    REVIEW OF SYSTEMS:   Review of Systems  Constitutional:  Positive for weight loss. Negative for chills, fever and malaise/fatigue.  HENT:  Negative for sore throat.   Eyes:  Negative for redness.  Respiratory:  Negative for cough, shortness of breath and wheezing.   Cardiovascular:  Negative for chest pain, palpitations and leg swelling.  Gastrointestinal:  Negative for abdominal pain, blood in stool, nausea and vomiting.  Genitourinary:  Negative for dysuria.  Musculoskeletal:  Negative for myalgias.  Skin:  Negative for rash.  Neurological:  Negative for dizziness, tingling and tremors.  Endo/Heme/Allergies:  Does not bruise/bleed easily.   Psychiatric/Behavioral:  Negative for hallucinations.      PAST MEDICAL HISTORY: Past Medical History:  Diagnosis Date   Colon polyps    personal history, adenomatous   Coronary atherosclerosis of autologous vein bypass graft    Coronary atherosclerosis of native coronary artery    Diabetes mellitus type 2, uncomplicated (HCC)    diet-controlled   Hyperlipidemia    Hypertension    Postmenopausal    Pure hypercholesterolemia     PAST SURGICAL HISTORY: Past Surgical History:  Procedure Laterality Date   ABDOMINAL HYSTERECTOMY  05/2001   TAH-BSO secondary to postmenopausal bleeding and uterine fibroids   APPENDECTOMY     BREAST BIOPSY Bilateral    1980's, benign per pt   BREAST EXCISIONAL BIOPSY Bilateral 92 and 82   CHOLECYSTECTOMY     COLONOSCOPY WITH PROPOFOL  N/A 11/07/2015   Procedure: COLONOSCOPY WITH PROPOFOL ;  Surgeon: Gladis RAYMOND Mariner, MD;  Location: [facility] ENDOSCOPY;  Service: Endoscopy;  Laterality: N/A;   COLONOSCOPY WITH PROPOFOL  N/A 03/16/2021   Procedure: COLONOSCOPY WITH PROPOFOL ;  Surgeon: Maryruth Ole DASEN, MD;  Location: [facility] ENDOSCOPY;  Service: Endoscopy;  Laterality: N/A;   CORONARY ARTERY BYPASS GRAFT  12/2007   single vessel CABG surgery    FAMILY HISTORY: Family History  Problem Relation Age of Onset   Breast cancer Neg Hx     HEALTH MAINTENANCE: Social History   Tobacco Use   Smoking status: Never   Smokeless tobacco: Never  Substance Use Topics   Alcohol use: No   Drug use: No     Allergies  Allergen Reactions   Amoxicillin    Lisinopril     Current Outpatient Medications  Medication Sig Dispense Refill   aspirin 81 MG tablet Take 81 mg by mouth daily.     atorvastatin (LIPITOR) 20 MG tablet Take 20 mg by mouth daily.     Calcium Carbonate-Vit D-Min (CALCIUM 600 + MINERALS) 600-200 MG-UNIT TABS Take by mouth.     fluticasone (FLONASE) 50 MCG/ACT nasal spray Place 2 sprays into the nose.     metoprolol succinate (TOPROL-XL) 100  MG 24 hr tablet Take 100 mg by mouth daily. Take with or immediately following a meal.     losartan-hydrochlorothiazide (HYZAAR) 50-12.5 MG tablet Take 1 tablet by mouth daily. (Patient not taking: Reported on 07/11/2024)     No current facility-administered medications for this visit.    OBJECTIVE: Vitals:   07/11/24 1439  BP: (!) 132/58  Pulse: 69  Resp: 18  Temp: (!) 97.3 F (36.3 C)  SpO2: 98%      Body mass index is 19.93 kg/m.      Physical Exam Constitutional:      General: She is not in acute distress.    Appearance: She is not diaphoretic.     Comments: cachetic  HENT:     Head: Normocephalic and atraumatic.  Eyes:     General: No scleral icterus. Cardiovascular:     Rate and Rhythm: Normal rate and regular rhythm.  Pulmonary:     Effort: Pulmonary effort is normal. No respiratory distress.     Breath sounds: No wheezing.  Abdominal:     General: There is no distension.     Palpations: Abdomen is soft.     Tenderness: There is no abdominal tenderness.  Musculoskeletal:        General: Normal range of motion.     Cervical back: Normal range of motion and neck supple.  Skin:    General: Skin is warm and dry.     Findings: No erythema.  Neurological:     Mental Status: She is alert and oriented to person, place, and time. Mental status is at baseline.     Motor: No abnormal muscle tone.  Psychiatric:        Mood and Affect: Mood and affect normal.       LAB RESULTS:  Lab Results  Component Value Date   NA 137 07/06/2024   K 3.6 07/06/2024   CL 99 07/06/2024   CO2 25 07/06/2024   GLUCOSE 99 07/06/2024   BUN 34 (H) 07/06/2024   CREATININE 1.72 (H) 07/06/2024   CALCIUM 9.1 07/06/2024   PROT 7.0 07/06/2024   ALBUMIN 4.0 07/06/2024   AST 29 07/06/2024   ALT 13 07/06/2024   ALKPHOS 62 07/06/2024   BILITOT 1.1 07/06/2024   GFRNONAA 30 (L) 07/06/2024    Lab Results  Component Value  Date   WBC 4.1 07/06/2024   NEUTROABS 2.1 07/06/2024   HGB  9.8 (L) 07/06/2024   HCT 29.5 (L) 07/06/2024   MCV 93.9 07/06/2024   PLT 169 07/06/2024    Lab Results  Component Value Date   TIBC 258 12/05/2023   TIBC 287 10/19/2023   FERRITIN 788 (H) 12/05/2023   FERRITIN 247 10/19/2023   IRONPCTSAT 18 12/05/2023   IRONPCTSAT 24 10/19/2023     STUDIES: No results found.

## 2024-07-11 NOTE — Assessment & Plan Note (Signed)
 IgM kappa MGUS.  M protein 0.6.   Previous bone marrow biopsy showed n ormocellular bone marrow with mild increase in the plasma cells to 6%.  No definitive morphologic or IHC evidence of involvement by lymphoproliferative disorder. Cytogenetics showed normal 46, XX karyotype.  Lab Results  Component Value Date   MPROTEIN Not Observed 07/06/2024   KPAFRELGTCHN 63.5 (H) 07/06/2024   LAMBDASER 27.3 (H) 07/06/2024   KAPLAMBRATIO 2.33 (H) 07/06/2024    Continue observation.

## 2024-07-11 NOTE — Assessment & Plan Note (Signed)
-  CT chest from March 2025 showed stable 6 mm left lung base nodule likely from scarring or lymph node.  No further follow-up imaging recommended.

## 2024-11-20 ENCOUNTER — Other Ambulatory Visit

## 2024-12-04 ENCOUNTER — Ambulatory Visit: Admitting: Oncology

## 2025-01-08 ENCOUNTER — Inpatient Hospital Stay

## 2025-01-17 ENCOUNTER — Inpatient Hospital Stay: Admitting: Oncology
